# Patient Record
Sex: Male | Born: 1960
Health system: Southern US, Community
[De-identification: ages and names within clinical notes are randomized; demographics above are authoritative.]

## PROBLEM LIST (undated history)

## (undated) DIAGNOSIS — E7800 Pure hypercholesterolemia, unspecified: Secondary | ICD-10-CM

## (undated) DIAGNOSIS — K4090 Unilateral inguinal hernia, without obstruction or gangrene, not specified as recurrent: Secondary | ICD-10-CM

## (undated) DIAGNOSIS — E785 Hyperlipidemia, unspecified: Secondary | ICD-10-CM

## (undated) DIAGNOSIS — L309 Dermatitis, unspecified: Secondary | ICD-10-CM

## (undated) DIAGNOSIS — R0981 Nasal congestion: Secondary | ICD-10-CM

## (undated) HISTORY — DX: Unilateral inguinal hernia, without obstruction or gangrene, not specified as recurrent: K40.90

## (undated) HISTORY — DX: Nasal congestion: R09.81

## (undated) HISTORY — DX: Hyperlipidemia, unspecified: E78.5

## (undated) HISTORY — PX: HEMORRHOID SURGERY: SHX153

---

## 2008-10-21 ENCOUNTER — Ambulatory Visit: Payer: Self-pay | Admitting: Family Medicine

## 2008-10-21 DIAGNOSIS — E785 Hyperlipidemia, unspecified: Secondary | ICD-10-CM | POA: Insufficient documentation

## 2008-10-22 LAB — CONVERTED CEMR LAB
ALT: 32 units/L (ref 0–53)
AST: 26 units/L (ref 0–37)
Albumin: 4.8 g/dL (ref 3.5–5.2)
Alkaline Phosphatase: 62 units/L (ref 39–117)
BUN: 12 mg/dL (ref 6–23)
CO2: 22 meq/L (ref 19–32)
Calcium: 9.2 mg/dL (ref 8.4–10.5)
Chloride: 106 meq/L (ref 96–112)
Cholesterol: 179 mg/dL (ref 0–200)
Creatinine, Ser: 0.91 mg/dL (ref 0.40–1.50)
Glucose, Bld: 96 mg/dL (ref 70–99)
HDL: 33 mg/dL — ABNORMAL LOW (ref >39)
LDL Cholesterol: 90 mg/dL (ref 0–99)
Potassium: 4.3 meq/L (ref 3.5–5.3)
Sodium: 144 meq/L (ref 135–145)
Total Bilirubin: 0.8 mg/dL (ref 0.3–1.2)
Total CHOL/HDL Ratio: 5.4
Total Protein: 7.2 g/dL (ref 6.0–8.3)
Triglycerides: 280 mg/dL — ABNORMAL HIGH (ref <150)
VLDL: 56 mg/dL — ABNORMAL HIGH (ref 0–40)

## 2009-04-21 ENCOUNTER — Ambulatory Visit: Payer: Self-pay | Admitting: Family Medicine

## 2009-04-22 ENCOUNTER — Encounter: Payer: Self-pay | Admitting: Family Medicine

## 2009-04-22 LAB — CONVERTED CEMR LAB
Cholesterol: 187 mg/dL (ref 0–200)
HDL: 34 mg/dL — ABNORMAL LOW (ref >39)
LDL Cholesterol: 90 mg/dL (ref 0–99)
Total CHOL/HDL Ratio: 5.5
Triglycerides: 315 mg/dL — ABNORMAL HIGH (ref <150)
VLDL: 63 mg/dL — ABNORMAL HIGH (ref 0–40)

## 2009-04-26 ENCOUNTER — Telehealth: Payer: Self-pay | Admitting: Family Medicine

## 2009-09-30 ENCOUNTER — Ambulatory Visit: Payer: Self-pay | Admitting: Family

## 2009-10-11 ENCOUNTER — Telehealth: Payer: Self-pay | Admitting: Family

## 2009-10-12 ENCOUNTER — Ambulatory Visit: Payer: Self-pay | Admitting: Family

## 2009-10-28 ENCOUNTER — Telehealth: Payer: Self-pay | Admitting: Family

## 2009-11-02 ENCOUNTER — Telehealth: Payer: Self-pay | Admitting: Family

## 2009-11-02 ENCOUNTER — Encounter: Payer: Self-pay | Admitting: Family

## 2009-11-03 ENCOUNTER — Telehealth: Payer: Self-pay | Admitting: Family

## 2009-11-07 ENCOUNTER — Encounter: Payer: Self-pay | Admitting: Family

## 2009-12-14 ENCOUNTER — Ambulatory Visit: Payer: Self-pay | Admitting: Family Medicine

## 2009-12-15 LAB — CONVERTED CEMR LAB
ALT: 49 units/L (ref 0–53)
AST: 37 units/L (ref 0–37)
Albumin: 4.7 g/dL (ref 3.5–5.2)
Alkaline Phosphatase: 42 units/L (ref 39–117)
BUN: 13 mg/dL (ref 6–23)
CO2: 23 meq/L (ref 19–32)
Calcium: 9.1 mg/dL (ref 8.4–10.5)
Chloride: 107 meq/L (ref 96–112)
Cholesterol: 195 mg/dL (ref 0–200)
Creatinine, Ser: 1.04 mg/dL (ref 0.40–1.50)
Glucose, Bld: 92 mg/dL (ref 70–99)
HDL: 37 mg/dL — ABNORMAL LOW (ref >39)
LDL Cholesterol: 128 mg/dL — ABNORMAL HIGH (ref 0–99)
PSA: 0.64 ng/mL (ref 0.10–4.00)
Potassium: 4.2 meq/L (ref 3.5–5.3)
Sodium: 141 meq/L (ref 135–145)
Total Bilirubin: 0.7 mg/dL (ref 0.3–1.2)
Total CHOL/HDL Ratio: 5.3
Total Protein: 6.7 g/dL (ref 6.0–8.3)
Triglycerides: 152 mg/dL — ABNORMAL HIGH (ref <150)
VLDL: 30 mg/dL (ref 0–40)

## 2009-12-21 ENCOUNTER — Ambulatory Visit: Payer: Self-pay | Admitting: Family Medicine

## 2010-01-02 ENCOUNTER — Telehealth: Payer: Self-pay | Admitting: Family Medicine

## 2010-01-03 ENCOUNTER — Telehealth: Payer: Self-pay | Admitting: Family Medicine

## 2010-01-03 ENCOUNTER — Encounter: Payer: Self-pay | Admitting: Family Medicine

## 2010-01-10 ENCOUNTER — Telehealth (INDEPENDENT_AMBULATORY_CARE_PROVIDER_SITE_OTHER): Payer: Self-pay | Admitting: *Deleted

## 2010-04-18 ENCOUNTER — Ambulatory Visit: Payer: Self-pay | Admitting: Family Medicine

## 2010-05-12 ENCOUNTER — Telehealth: Payer: Self-pay | Admitting: Family Medicine

## 2010-06-20 NOTE — Progress Notes (Signed)
  Phone Note Outgoing Call   Call placed by: Lemont Fillers FNP,  November 03, 2009 8:13 AM Call placed to: Patient Summary of Call: Called patient- patient tells me that he saw opthalmology yesterday- they performed culture and discontinued drops.  He has f/u with them on Monday. Initial call taken by: Lemont Fillers FNP,  November 03, 2009 8:14 AM

## 2010-06-20 NOTE — Progress Notes (Signed)
Summary: ?referral  Phone Note Call from Patient Call back at [phone]   Caller: Patient Call For: Sandford Craze, NP Summary of Call: Pt called and states that he is still using the Ciloxin and that today his eye looks like it is infected again. Does he need referral to a specialist?  States that he is unable to come in for appt. until Monday due to staff shortage at work.  Please advise.  Mervin Kung CMA  November 02, 2009 1:51 PM   Follow-up for Phone Call        I would like for him to see an Opthalmologist and have made that referral. Should he develop eye pain or visual changes in the meantime he should be seen in the ED. Follow-up by: Lemont Fillers FNP,  November 02, 2009 2:04 PM  Additional Follow-up for Phone Call Additional follow up Details #1::        Pt advised of Delanda Bulluck's instructions. He asked that I call Dr. Otho Najjar [phone] and make appt with her.  I advised pt. that I would check with them but if they are not an opthalmologist we would have to refer elsewhere.  Spoke to Dr Wende Mott receptionist and she stated they do not have an ophthalmologist.  Mervin Kung CMA  November 02, 2009 2:43 PM     Additional Follow-up for Phone Call Additional follow up Details #2::    Diane spoke to pt and scheduled him an appt. for today with Dr. Hardie Shackleton in [place]. Pt has been notified.  Mervin Kung CMA  November 02, 2009 3:41 PM

## 2010-06-20 NOTE — Miscellaneous (Signed)
  Clinical Lists Changes  Medications: Removed medication of LIPITOR 80 MG TABS (ATORVASTATIN CALCIUM) Take 1 tablet by mouth once a day at bedtime Added new medication of LOVASTATIN 40 MG TABS (LOVASTATIN) take one tablet by mouth at bedtime - Signed Rx of LOVASTATIN 40 MG TABS (LOVASTATIN) take one tablet by mouth at bedtime;  #30 x 3;  Signed;  Entered by: Avon Gully CMA, (AAMA);  Authorized by: Nani Gasser MD;  Method used: Electronically to [facility] #[id number]*, [address] [place], [place]  [address], Ph: [phone], Fax: [phone]    Prescriptions: LOVASTATIN 40 MG TABS (LOVASTATIN) take one tablet by mouth at bedtime  #30 x 3   Entered by:   Avon Gully CMA, (AAMA)   Authorized by:   Nani Gasser MD   Signed by:   Avon Gully CMA, (AAMA) on 01/03/2010   Method used:   Electronically to        [facility] #[id number]* (retail)       [address], [place]  [address]       Ph: [phone]       Fax: [phone]   RxID:   [id number]

## 2010-06-20 NOTE — Assessment & Plan Note (Signed)
Summary: CPE   Vital Signs:  Patient profile:   50 year old male Height:      73 inches Weight:      238 pounds Pulse rate:   65 / minute BP sitting:   139 / 85  (left arm) Cuff size:   regular  Vitals Entered By: Avon Gully CMA, Duncan Dull) (December 21, 2009 11:04 AM) CC: CPE   Primary Care Provider:  Nani Gasser, MD  CC:  CPE.  History of Present Illness: Here for CPE. Lenses are up to date.  Here to review the labwork. Did increase the lovastatin and tolerating well.   Habits & Providers  Alcohol-Tobacco-Diet     Tobacco Status: quit  Exercise-Depression-Behavior     Does Patient Exercise: yes     Drug Use: no  Current Medications (verified): 1)  Lovastatin 40 Mg Tabs (Lovastatin) .... Take One Tablet By Mouth Once A Day At Bedtime 2)  Niacin 500 Mg Tabs (Niacin) .... Take One Tablet By Mouth Twice A Day 3)  Metamucil 30.9 % Powd (Psyllium) .... Daily 4)  Fish Oil 1000 Mg Caps (Omega-3 Fatty Acids) .... Take 3 Tabs By Mouth Once A Day. 5)  Fenofibrate Micronized 200 Mg Caps (Fenofibrate Micronized) .... Take 1 Tablet By Mouth Once A Day At Bedtime 6)  Zyrtec Allergy 10 Mg Tabs (Cetirizine Hcl) .... Take 1 Tablet By Mouth Once A Day  Allergies (verified): No Known Drug Allergies  Comments:  Nurse/Medical Assistant: The patient's medications and allergies were reviewed with the patient and were updated in the Medication and Allergy Lists. Avon Gully CMA, Duncan Dull) (December 21, 2009 11:04 AM)  Past History:  Past Medical History: Last updated: 10/21/2008 Hi cholesterol  Past Surgical History: Last updated: 10/21/2008 Hemorrhoidectomy  Family History: Last updated: 12/21/2009 Father with Dm, MI (triple bypass), melanoma, hi cholesterol Mom with HTN, and abdominal aneurysm.   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  [employer].  Married to [place].  Lives with his wife. No kids.   Former Smoker Alcohol use-yes beer  twice a week.  Drug use-no Regular exercise-yes, walking.   Family History: Father with Dm, MI (triple bypass), melanoma, hi cholesterol Mom with HTN, and abdominal aneurysm.   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  Married to [place].  Lives with his wife. No kids.   Former Smoker Alcohol use-yes beer twice a week.  Drug use-no Regular exercise-yes, walking.  Smoking Status:  quit Drug Use:  no Does Patient Exercise:  yes  Review of Systems  The patient denies anorexia, fever, weight loss, weight gain, vision loss, decreased hearing, hoarseness, chest pain, syncope, dyspnea on exertion, peripheral edema, prolonged cough, headaches, hemoptysis, abdominal pain, melena, hematochezia, severe indigestion/heartburn, hematuria, incontinence, genital sores, muscle weakness, suspicious skin lesions, transient blindness, difficulty walking, depression, unusual weight change, abnormal bleeding, enlarged lymph nodes, angioedema, and testicular masses.    Physical Exam  General:  Well-developed,well-nourished,in no acute distress; alert,appropriate and cooperative throughout examination Head:  Normocephalic and atraumatic without obvious abnormalities. No apparent alopecia or balding. Eyes:  No corneal or conjunctival inflammation noted. EOMI. Perrla.  Ears:  External ear exam shows no significant lesions or deformities.  Otoscopic examination reveals clear canals, tympanic membranes are intact bilaterally without bulging, retraction, inflammation or discharge. Hearing is grossly normal bilaterally. Nose:  External nasal examination shows no deformity or inflammation.  Mouth:  Oral mucosa and oropharynx without lesions or exudates.  Teeth  in good repair. Neck:  No deformities, masses, or tenderness noted. Chest Wall:  No deformities, masses, tenderness or gynecomastia noted. Lungs:  Normal respiratory effort, chest expands symmetrically. Lungs are clear to auscultation, no  crackles or wheezes. Heart:  Normal rate and regular rhythm. S1 and S2 normal without gallop, murmur, click, rub or other extra sounds. Abdomen:  Bowel sounds positive,abdomen soft and non-tender without masses, organomegaly or hernias noted. Rectal:  no external abnormalities, no hemorrhoids, normal sphincter tone, and no masses.   Prostate:  no gland enlargement, no nodules, and no asymmetry.   Msk:  No deformity or scoliosis noted of thoracic or lumbar spine.   Pulses:  R and L carotid,radial,dorsalis pedis and posterior tibial pulses are full and equal bilaterally Extremities:  No clubbing, cyanosis, edema, or deformity noted with normal full range of motion of all joints.   Neurologic:  No cranial nerve deficits noted. Station and gait are normal. DTRs are symmetrical throughout. Sensory, motor and coordinative functions appear intact. Skin:  no rashes.   Cervical Nodes:  No lymphadenopathy noted Psych:  Cognition and judgment appear intact. Alert and cooperative with normal attention span and concentration. No apparent delusions, illusions, hallucinations   Impression & Recommendations:  Problem # 1:  HEALTH MAINTENANCE EXAM (ICD-V70.0)  Exam is normal today.  Reviewed his labs.   EKG shows  NSR, rate 63 pm, Inverted T wave in Lead 3 but not significant.   Vaccines are up to date.   Orders: EKG w/ Interpretation (93000)  Problem # 2:  CORONARY ARTERY DISEASE, FAMILY HX (ICD-V17.3) Will get EKG as a baseline. He has not been having any CP.    Complete Medication List: 1)  Lovastatin 40 Mg Tabs (Lovastatin) .... Take one tablet by mouth once a day at bedtime 2)  Niacin 500 Mg Tabs (Niacin) .... Take one tablet by mouth twice a day 3)  Metamucil 30.9 % Powd (Psyllium) .... Daily 4)  Fish Oil 1000 Mg Caps (Omega-3 fatty acids) .... Take 3 tabs by mouth once a day. 5)  Fenofibrate Micronized 200 Mg Caps (Fenofibrate micronized) .... Take 1 tablet by mouth once a day at bedtime 6)   Zyrtec Allergy 10 Mg Tabs (Cetirizine hcl) .... Take 1 tablet by mouth once a day  Patient Instructions: 1)  Recheck cholesterol in 6 months.  2)  We would recommend one time screening for abdominal aortic aneurysm at age 25 since you are a former smoker.   Flex Sig Next Due:  Not Indicated Hemoccult Next Due:  Not Indicated  Appended Document: CPE No caortid bruits on exam.

## 2010-06-20 NOTE — Progress Notes (Signed)
Summary: Triage call- Call patient back   Phone Note Call from Patient   Caller: Patient Summary of Call: Pt. seen Sandford Craze last week and has been finished with the antibiotics for about 4days now and now his eye is starting to look bad again and bother the patient. Please call pt. back and let him know what you think ... Call patient at [phone] Initial call taken by: Michaelle Copas,  Oct 11, 2009 11:54 AM  Follow-up for Phone Call        Please call patient and let him know that he should be seen in the office.  I have some openings this afternoon.  Thanks Follow-up by: Lemont Fillers FNP,  Oct 11, 2009 1:10 PM  Additional Follow-up for Phone Call Additional follow up Details #1::        I called pt. and scheduled him for the fist appt. time with Sandford Craze at 0800 in the morning 10/12/09 at the [facility] Location.Michaelle Copas  Oct 11, 2009 4:34 PM  Additional Follow-up by: Michaelle Copas,  Oct 11, 2009 4:34 PM

## 2010-06-20 NOTE — Assessment & Plan Note (Signed)
Summary: Hyperlipidemia   Vital Signs:  Patient profile:   50 year old male Height:      73 inches Weight:      238 pounds BMI:     31.51 Pulse rate:   61 / minute BP sitting:   114 / 70  (left arm) Cuff size:   regular  Vitals Entered By: Avon Gully CMA, Duncan Dull) (December 14, 2009 8:33 AM) CC: f/u cholesterol, fasting   Primary Care Provider:  Nani Gasser, MD  CC:  f/u cholesterol and fasting.  History of Present Illness: No side effects from medications.  Doing well. Denies any side effects. They are affordable. Was unable to afford the Niaspan.    Current Medications (verified): 1)  Lovastatin 40 Mg Tabs (Lovastatin) .... Take One Tablet By Mouth Once A Day At Bedtime 2)  Niacin 500 Mg Tabs (Niacin) .... Take One Tablet By Mouth Twice A Day 3)  Metamucil 30.9 % Powd (Psyllium) .... Daily 4)  Fish Oil 1000 Mg Caps (Omega-3 Fatty Acids) .... Take 3 Tabs By Mouth Once A Day. 5)  Fenofibrate Micronized 200 Mg Caps (Fenofibrate Micronized) .... Take 1 Tablet By Mouth Once A Day At Bedtime 6)  Zyrtec Allergy 10 Mg Tabs (Cetirizine Hcl) .... Take 1 Tablet By Mouth Once A Day  Allergies (verified): No Known Drug Allergies  Comments:  Nurse/Medical Assistant: The patient's medications and allergies were reviewed with the patient and were updated in the Medication and Allergy Lists. Avon Gully CMA, Duncan Dull) (December 14, 2009 8:35 AM)  Physical Exam  General:  Well-developed,well-nourished,in no acute distress; alert,appropriate and cooperative throughout examination Lungs:  Normal respiratory effort, chest expands symmetrically. Lungs are clear to auscultation, no crackles or wheezes. Heart:  Normal rate and regular rhythm. S1 and S2 normal without gallop, murmur, click, rub or other extra sounds. Skin:  no rashes.   Psych:  Cognition and judgment appear intact. Alert and cooperative with normal attention span and concentration. No apparent delusions, illusions,  hallucinations   Impression & Recommendations:  Problem # 1:  HYPERLIPIDEMIA (ICD-272.4) Doing well. Due to recheck labs on the medication.  Can consider Simcor or Advicor  but likely will be more expensive.  Plans to schedule a CPE soon so will get complete labs ande check PSA since age 71.   His updated medication list for this problem includes:    Lovastatin 40 Mg Tabs (Lovastatin) .Marland Kitchen... Take one tablet by mouth once a day at bedtime    Niacin 500 Mg Tabs (Niacin) .Marland Kitchen... Take one tablet by mouth twice a day    Fenofibrate Micronized 200 Mg Caps (Fenofibrate micronized) .Marland Kitchen... Take 1 tablet by mouth once a day at bedtime  Orders: T-Lipid Profile ([id number])  Complete Medication List: 1)  Lovastatin 40 Mg Tabs (Lovastatin) .... Take one tablet by mouth once a day at bedtime 2)  Niacin 500 Mg Tabs (Niacin) .... Take one tablet by mouth twice a day 3)  Metamucil 30.9 % Powd (Psyllium) .... Daily 4)  Fish Oil 1000 Mg Caps (Omega-3 fatty acids) .... Take 3 tabs by mouth once a day. 5)  Fenofibrate Micronized 200 Mg Caps (Fenofibrate micronized) .... Take 1 tablet by mouth once a day at bedtime 6)  Zyrtec Allergy 10 Mg Tabs (Cetirizine hcl) .... Take 1 tablet by mouth once a day  Other Orders: T-Comprehensive Metabolic Panel ([id number]) T-PSA ([id number])  Patient Instructions: 1)  We will call you with your lab results. Let me know if interested in  the Simcor 500/40.  Prescriptions: LOVASTATIN 40 MG TABS (LOVASTATIN) Take one tablet by mouth once a day at bedtime  #90 x 3   Entered and Authorized by:   Nani Gasser MD   Signed by:   Nani Gasser MD on 12/14/2009   Method used:   Electronically to        [facility] #[id number]* (retail)       [address], [place]  [address]       Ph: [phone]       Fax: [phone]   RxID:   [id number] FENOFIBRATE MICRONIZED 200 MG CAPS (FENOFIBRATE MICRONIZED) Take 1 tablet by mouth once a day at  bedtime  #90 x 3   Entered and Authorized by:   Nani Gasser MD   Signed by:   Nani Gasser MD on 12/14/2009   Method used:   Electronically to        [facility] #[id number]* (retail)       [address], [place]  [address]       Ph: [phone]       Fax: [phone]   RxID:   [id number]

## 2010-06-20 NOTE — Progress Notes (Signed)
Summary: No discount thru ins for Niaspan  Phone Note Call from Patient Call back at Home Phone [phone] Call back at [phone]   Caller: Patient Call For: Nani Gasser MD Summary of Call: Can not get the discount with the Niaspan thru the insurane so what does [facility] need [facility]. [facility] Initial call taken by: Kathlene November,  April 26, 2009 11:27 AM  Follow-up for Phone Call        How much is it without the coupon card.  Follow-up by: Nani Gasser MD,  April 26, 2009 12:29 PM  Additional Follow-up for Phone Call Additional follow up Details #1::        He didn't say but he did say he could not afford it Additional Follow-up by: Kathlene November,  April 26, 2009 12:48 PM    Additional Follow-up for Phone Call Additional follow up Details #2::    Then can try generic tricor.  Follow-up by: Nani Gasser MD,  April 26, 2009 1:02 PM  Additional Follow-up for Phone Call Additional follow up Details #3:: Details for Additional Follow-up Action Taken: Pt notifeid to try the new med Additional Follow-up by: Kathlene November,  April 26, 2009 1:10 PM  New/Updated Medications: FENOFIBRATE MICRONIZED 200 MG CAPS (FENOFIBRATE MICRONIZED) Take 1 tablet by mouth once a day at bedtime Prescriptions: FENOFIBRATE MICRONIZED 200 MG CAPS (FENOFIBRATE MICRONIZED) Take 1 tablet by mouth once a day at bedtime  #30 x 2   Entered and Authorized by:   Nani Gasser MD   Signed by:   Nani Gasser MD on 04/26/2009   Method used:   Electronically to        [facility] #[id number]* (retail)       [address], [place]  [address]       Ph: [phone]       Fax: [phone]   RxID:   [id number]

## 2010-06-20 NOTE — Assessment & Plan Note (Signed)
Summary: Cody Huber PATIENT--ALLERGYS//VGJ   Vital Signs:  Patient profile:   50 year old male Height:      73 inches Weight:      237.75 pounds BMI:     31.48 Temp:     98.3 degrees F oral Pulse rate:   90 / minute Pulse rhythm:   regular Resp:     16 per minute BP sitting:   120 / 90  (right arm) Cuff size:   large  Vitals Entered By: Mervin Kung CMA (Sep 30, 2009 9:25 AM) CC: room 4  Bilateral eye redness, swelling and itching. Left eye seems worse than the right. Is Patient Diabetic? No   Primary Care Provider:  Nani Gasser, MD  CC:  room 4  Bilateral eye redness and swelling and itching. Left eye seems worse than the right..  History of Present Illness: Cody Huber is a 50 year old male who presents with c/o of 1 week of eye itching redness.  Eyes were crusted and shut yesterday.  Today continues to water.  He does have chronic nasal drainage which he attributes to seasonal allergies.  He has been using pataday eye drops without improvement.    Allergies (verified): No Known Drug Allergies  Physical Exam  General:  Well-developed,well-nourished,in no acute distress; alert,appropriate and cooperative throughout examination Head:  Normocephalic and atraumatic without obvious abnormalities. No apparent alopecia or balding. Eyes:  + scleral injection bilaterally left greater than right. Neck:  No deformities, masses, or tenderness noted. Lungs:  Normal respiratory effort, chest expands symmetrically. Lungs are clear to auscultation, no crackles or wheezes. Heart:  Normal rate and regular rhythm. S1 and S2 normal without gallop, murmur, click, rub or other extra sounds.   Impression & Recommendations:  Problem # 1:  CONJUNCTIVITIS, ACUTE, BILATERAL (ICD-372.00) Assessment New will treat with ciloxan drops.  Suspect that this started with allergic conjunctivitis, but has turned into bacterial infection.  Pt instructed to continue Zyrtec.  He is considering  referral to allergist- but not yet ready.  He will let us know.  The following medications were removed from the medication list:    Erythromycin 5 Mg/gm Oint (Erythromycin) .Marland Kitchen... Apply to affected eye twice a day. His updated medication list for this problem includes:    Ciloxan 0.3 % Soln (Ciprofloxacin hcl) .Marland Kitchen... 2 drops each eye every 2 hours wile awake x 2 days,  then every 4 hours for 5 days  Complete Medication List: 1)  Lovastatin 40 Mg Tabs (Lovastatin) .... Take one tablet by mouth once a day at bedtime 2)  Niacin 500 Mg Tabs (Niacin) .... Take one tablet by mouth twice a day 3)  Metamucil 30.9 % Powd (Psyllium) .... Daily 4)  Fish Oil 1000 Mg Caps (Omega-3 fatty acids) .... Take 4 tabs by mouth once a day. 5)  Fenofibrate Micronized 200 Mg Caps (Fenofibrate micronized) .... Take 1 tablet by mouth once a day at bedtime 6)  Pataday 0.2 % Soln (Olopatadine hcl) .... Instill one drop in ezch eye daily 7)  Zyrtec Allergy 10 Mg Tabs (Cetirizine hcl) .... Take 1 tablet by mouth once a day 8)  Ciloxan 0.3 % Soln (Ciprofloxacin hcl) .... 2 drops each eye every 2 hours wile awake x 2 days,  then every 4 hours for 5 days  Patient Instructions: 1)  Call if your symptoms worsen, do not improve. 2)  Call immediately if your develop visual problems. Prescriptions: CILOXAN 0.3 % SOLN (CIPROFLOXACIN HCL) 2 drops each eye  every 2 hours wile awake x 2 days,  then every 4 hours for 5 days  #1 x 0   Entered and Authorized by:   Lemont Fillers FNP   Signed by:   Lemont Fillers FNP on 09/30/2009   Method used:   Electronically to        [facility] #[id number]* (retail)       [address], [place]  [address]       Ph: [phone]       Fax: [phone]   RxID:   [id number]   Current Allergies (reviewed today): No known allergies

## 2010-06-20 NOTE — Assessment & Plan Note (Signed)
Summary: NOV: hyperlipidemia   Vital Signs:  Patient profile:   50 year old male Height:      73 inches Weight:      240 pounds BMI:     31.78 Pulse rate:   73 / minute BP sitting:   124 / 75  (left arm) Cuff size:   large  Vitals Entered By: Kathlene November (October 21, 2008 8:37 AM) CC: NP- needs cholesterol checked and med renewal Is Patient Diabetic? No Pain Assessment Patient in pain? no        Primary Care Provider:  Nani Gasser, MD  CC:  NP- needs cholesterol checked and med renewal.  History of Present Illness: NP- needs cholesterol checked and med renewal.  has been on current meds for awhile. Tolerating very well. Doesn't take an ASA.  Thinks last Td was about 10 years ago.   Current Medications (verified): 1)  Lovastatin 40 Mg Tabs (Lovastatin) .... Take One Tablet By Mouth Once A Day 2)  Niacin 500 Mg Tabs (Niacin) .... Take One Tablet By Mouth Twice A Day  Allergies (verified): No Known Drug Allergies  Comments:  Nurse/Medical Assistant: The patient's medications and allergies were reviewed with the patient and were updated in the Medication and Allergy Lists. Kathlene November (October 21, 2008 8:39 AM)  Past History:  Past Medical History: Hi cholesterol  Past Surgical History: Hemorrhoidectomy  Family History: Father with Dm, MI (triple bypass), melanoma, hi cholesterol Mom with HTN.   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  Married to [place].  Lives with his wife. No kids.    Review of Systems       No fever/sweats/weakness, unexplained weight loss/gain.  No vison changes.  No difficulty hearing/ringing in ears, hay fever/allergies.  No chest pain/discomfort, palpitations.  No Br lump/nipple discharge.  No cough/wheeze.  No blood in BM, nausea/vomiting/diarrhea.  No nighttime urination, leaking urine, unusual vaginal bleeding, discharge (penis or vagina).  No muscle/joint pain. No rash, change in mole.  No HA, memory loss.  No  anxiety, sleep d/o, depression.  No easy bruising/bleeding, unexplained lump   Physical Exam  General:  Well-developed,well-nourished,in no acute distress; alert,appropriate and cooperative throughout examination Head:  Normocephalic and atraumatic without obvious abnormalities. No apparent alopecia or balding. Eyes:  No corneal or conjunctival inflammation noted. EOMI. Perrla.  Neck:  No deformities, masses, or tenderness noted.  No TM.  Lungs:  Normal respiratory effort, chest expands symmetrically. Lungs are clear to auscultation, no crackles or wheezes. Heart:  Normal rate and regular rhythm. S1 and S2 normal without gallop, murmur, click, rub or other extra sounds.  No carotid bruits.  Pulses:  Radial 2+  Extremities:  No LE edema.  Skin:  no rashes.   Cervical Nodes:  No lymphadenopathy noted Psych:  Cognition and judgment appear intact. Alert and cooperative with normal attention span and concentration. No apparent delusions, illusions, hallucinations   Impression & Recommendations:  Problem # 1:  HYPERLIPIDEMIA (ICD-272.4) Due for labwork.  Also due for refills. Takes OTC niacin.  His updated medication list for this problem includes:    Lovastatin 40 Mg Tabs (Lovastatin) .Marland Kitchen... Take one tablet by mouth once a day at bedtime    Niacin 500 Mg Tabs (Niacin) .Marland Kitchen... Take one tablet by mouth twice a day  Orders: T-Ferritin ([id number]) T-Comprehensive Metabolic Panel ([id number])  Complete Medication List: 1)  Lovastatin 40 Mg Tabs (Lovastatin) .... Take one tablet by mouth once a day  at bedtime 2)  Niacin 500 Mg Tabs (Niacin) .... Take one tablet by mouth twice a day 3)  Metamucil 30.9 % Powd (Psyllium) .... Daily Prescriptions: LOVASTATIN 40 MG TABS (LOVASTATIN) Take one tablet by mouth once a day at bedtime  #90 x 1   Entered and Authorized by:   Nani Gasser MD   Signed by:   Nani Gasser MD on 10/21/2008   Method used:   Electronically to        [facility] #3303* (retail)       [address], [place]  [address]       Ph: [phone]       Fax: [phone]   RxID:   [id number]      Preventive Care Screening  Colonoscopy:    Date:  03/22/2003    Next Due:  03/2013    Results:  normal

## 2010-06-20 NOTE — Progress Notes (Signed)
  Phone Note Refill Request   Caller: Patient Call For: Nani Gasser MD Summary of Call: pt called and states he wants the Lovastatin to be called in for a 90 day supply because it is cheaper Initial call taken by: Avon Gully CMA, Duncan Dull),  January 10, 2010 9:57 AM    Prescriptions: LOVASTATIN 40 MG TABS (LOVASTATIN) take one tablet by mouth at bedtime  #90 x 1   Entered by:   Avon Gully CMA, (AAMA)   Authorized by:   Nani Gasser MD   Signed by:   Avon Gully CMA, (AAMA) on 01/10/2010   Method used:   Electronically to        [id number]* (retail)       [address] [place], [place]  [address]       Ph: [phone]       Fax: [phone]   RxID:   [id number]

## 2010-06-20 NOTE — Assessment & Plan Note (Signed)
Summary: FLU-SHOT  Nurse Visit   Allergies: No Known Drug Allergies  Immunizations Administered:  Influenza Vaccine # 1:    Vaccine Type: Fluvax 3+    Site: left deltoid    Mfr: GlaxoSmithKline    Dose: 0.5 ml    Route: IM    Given by: Sue Lush McCrimmon CMA, (AAMA)    Exp. Date: 10/20/2011    Lot #: [id number]    VIS given: 12/13/09 version given April 18, 2010.  Flu Vaccine Consent Questions:    Do you have a history of severe allergic reactions to this vaccine? no    Any prior history of allergic reactions to egg and/or gelatin? no    Do you have a sensitivity to the preservative Thimersol? no    Do you have a past history of Guillan-Barre Syndrome? no    Do you currently have an acute febrile illness? no    Have you ever had a severe reaction to latex? no    Vaccine information given and explained to patient? no  Orders Added: 1)  Flu Vaccine 75yrs + [90658] 2)  Admin 1st Vaccine [09811]

## 2010-06-20 NOTE — Progress Notes (Signed)
Summary: eye infection?  Phone Note Call from Patient Call back at [phone] Can leave detailed msg on voicemail.   Summary of Call: States he completed Polymixin for his eyes x 1 week. Symptoms have returned since yesterday.  Please advise.  Mervin Kung CMA  October 28, 2009 10:13 AM   Follow-up for Phone Call        Pls advise patient to try ciloxan x 7 days.  Call if symptoms worsen or do not improve. Follow-up by: Lemont Fillers FNP,  October 28, 2009 12:31 PM  Additional Follow-up for Phone Call Additional follow up Details #1::        Pt advised per Maryuri Warnke's instructions and voices understanding.  Nicki Guadalajara Fergerson CMA  October 28, 2009 2:25 PM     New/Updated Medications: CILOXAN 0.3 % SOLN (CIPROFLOXACIN HCL) 2 drops each eye every 2 hours wile awake x 2 days,  then every 4 hours for 7 days Prescriptions: CILOXAN 0.3 % SOLN (CIPROFLOXACIN HCL) 2 drops each eye every 2 hours wile awake x 2 days,  then every 4 hours for 7 days  #1 x 0   Entered and Authorized by:   Lemont Fillers FNP   Signed by:   Lemont Fillers FNP on 10/28/2009   Method used:   Electronically to        [id number]* (retail)       [address], [place]  [address]       Ph: [phone]       Fax: [phone]   RxID:   [id number]

## 2010-06-20 NOTE — Progress Notes (Signed)
Summary: meds  Phone Note Call from Patient   Caller: Patient Call For: Nani Gasser MD Summary of Call: pt wants to know if there is a generic for his lipitor because he has to pay for meds up front out of pocket Initial call taken by: Avon Gully CMA, Duncan Dull),  January 03, 2010 11:13 AM  Follow-up for Phone Call        what is avail is not strong enough for him, can stay on same med until the end of year or can look up Lipitor online and see if they have an indegent program per Dr. Linford Arnold Follow-up by: Avon Gully CMA, Duncan Dull),  January 03, 2010 11:15 AM  Additional Follow-up for Phone Call Additional follow up Details #1::        notified pt and he states he will call pharm first to see how much the medication is first and then decide Additional Follow-up by: Avon Gully CMA, Duncan Dull),  January 03, 2010 11:16 AM

## 2010-06-20 NOTE — Progress Notes (Signed)
Summary: meds  Phone Note Call from Patient   Caller: Patient Call For: Nani Gasser MD Summary of Call: pt states his lovastatin dose was supposed to be increased and it has not been changed Initial call taken by: Avon Gully CMA, Duncan Dull),  January 02, 2010 1:21 PM  Follow-up for Phone Call        OK, changed to lipitor 80mg . Can go online and print off coupion that makes the co-pay $4 and then recheck labs in 8 weeks.  Follow-up by: Nani Gasser MD,  January 02, 2010 2:01 PM  Additional Follow-up for Phone Call Additional follow up Details #1::        left message on pts cell Additional Follow-up by: Avon Gully CMA, Duncan Dull),  January 02, 2010 2:09 PM    New/Updated Medications: LIPITOR 80 MG TABS (ATORVASTATIN CALCIUM) Take 1 tablet by mouth once a day at bedtime Prescriptions: LIPITOR 80 MG TABS (ATORVASTATIN CALCIUM) Take 1 tablet by mouth once a day at bedtime  #30 x 2   Entered and Authorized by:   Nani Gasser MD   Signed by:   Nani Gasser MD on 01/02/2010   Method used:   Electronically to        [facility] #[id number]* (retail)       [address], [place]  [address]       Ph: [phone]       Fax: [phone]   RxID:   [id number]

## 2010-06-20 NOTE — Consult Note (Signed)
Summary: [name]  [facility]   Imported By: [name] 11/16/2009 08:19:40  _____________________________________________________________________  External Attachment:    Type:   Image     Comment:   External Document

## 2010-06-20 NOTE — Assessment & Plan Note (Signed)
Summary: FU Lipids, vaccines   Vital Signs:  Patient profile:   50 year old male Height:      73 inches Weight:      244 pounds Pulse rate:   70 / minute BP sitting:   128 / 88  (left arm) Cuff size:   large  Vitals Entered By: Kathlene November (April 21, 2009 8:53 AM) CC: follow-up on cholesterol Flu Vaccine Consent Questions     Do you have a history of severe allergic reactions to this vaccine? no    Any prior history of allergic reactions to egg and/or gelatin? no    Do you have a sensitivity to the preservative Thimersol? no    Do you have a past history of Guillan-Barre Syndrome? no    Do you currently have an acute febrile illness? no    Have you ever had a severe reaction to latex? no    Vaccine information given and explained to patient? yes    Are you currently pregnant? no    Lot Number:[id number]   Exp Date:11/17/2009   Site Given  Left Deltoid IM   Primary Care Provider:  Nani Gasser, MD  CC:  follow-up on cholesterol.  History of Present Illness: tolerating fish oil well overall.  Still on his other cholesterol meds. Still walking his dogs everyday, about  1 mile.  SayHe has been consistant with his meds.  Denies any side effects.  Still doesn't really have drug coverage.   Current Medications (verified): 1)  Lovastatin 40 Mg Tabs (Lovastatin) .... Take One Tablet By Mouth Once A Day At Bedtime 2)  Niacin 500 Mg Tabs (Niacin) .... Take One Tablet By Mouth Twice A Day 3)  Metamucil 30.9 % Powd (Psyllium) .... Daily 4)  Fish Oil 1000 Mg Caps (Omega-3 Fatty Acids) .... Take One Tablet By Mouth Twice A Day  Allergies (verified): No Known Drug Allergies  Comments:  Nurse/Medical Assistant: The patient's medications and allergies were reviewed with the patient and were updated in the Medication and Allergy Lists. Kathlene November (April 21, 2009 8:55 AM)  Physical Exam  General:  Well-developed,well-nourished,in no acute distress; alert,appropriate and  cooperative throughout examination Head:  Normocephalic and atraumatic without obvious abnormalities. No apparent alopecia or balding. Lungs:  Normal respiratory effort, chest expands symmetrically. Lungs are clear to auscultation, no crackles or wheezes. Heart:  Normal rate and regular rhythm. S1 and S2 normal without gallop, murmur, click, rub or other extra sounds. No carotid bruits.  Skin:  no rashes.   Psych:  Cognition and judgment appear intact. Alert and cooperative with normal attention span and concentration. No apparent delusions, illusions, hallucinations   Impression & Recommendations:  Problem # 1:  HYPERLIPIDEMIA (ICD-272.4) Due to recheck today. Hopefully the fish oil has helped his TG. Also gave him a coupon card to try to get niaspan 500 instead of the OTC niacin.  He says he will try.  FU in 3-4 months for a CPE at age 51.  BP looks great today.  His updated medication list for this problem includes:    Lovastatin 40 Mg Tabs (Lovastatin) .Marland Kitchen... Take one tablet by mouth once a day at bedtime    Niacin 500 Mg Tabs (Niacin) .Marland Kitchen... Take one tablet by mouth twice a day    Niaspan 500 Mg Cr-tabs (Niacin (antihyperlipidemic)) .Marland Kitchen... Take 1 tablet by mouth once a day  Orders: T-Lipid Profile ([address])  Complete Medication List: 1)  Lovastatin 40 Mg Tabs (Lovastatin) .Marland KitchenMarland KitchenMarland Kitchen  Take one tablet by mouth once a day at bedtime 2)  Niacin 500 Mg Tabs (Niacin) .... Take one tablet by mouth twice a day 3)  Metamucil 30.9 % Powd (Psyllium) .... Daily 4)  Fish Oil 1000 Mg Caps (Omega-3 fatty acids) .... Take one tablet by mouth twice a day 5)  Niaspan 500 Mg Cr-tabs (Niacin (antihyperlipidemic)) .... Take 1 tablet by mouth once a day  Other Orders: Admin 1st Vaccine ([address]) Flu Vaccine 63yrs + ([address]) Tdap => 57yrs IM (09811) Admin of Any Addtl Vaccine ([address]) Prescriptions: LOVASTATIN 40 MG TABS (LOVASTATIN) Take one tablet by mouth once a day at bedtime  #90 x 2   Entered and  Authorized by:   Nani Gasser MD   Signed by:   Nani Gasser MD on 04/21/2009   Method used:   Electronically to        [facility] #[id number]* (retail)       [address], [place]  [address]       Ph: [phone]       Fax: [phone]   RxID:   [id number] NIASPAN 500 MG CR-TABS (NIACIN (ANTIHYPERLIPIDEMIC)) Take 1 tablet by mouth once a day  #30 x 1   Entered and Authorized by:   Nani Gasser MD   Signed by:   Nani Gasser MD on 04/21/2009   Method used:   Print then Give to Patient   RxID:   [id number]     Immunizations Administered:  Tetanus Vaccine:    Vaccine Type: Tdap    Site: right deltoid    Mfr: GlaxoSmithKline    Dose: 0.5 ml    Route: IM    Given by: Kathlene November    Exp. Date: 12/04/2010    Lot #: [id number]    VIS given: 04/08/07 version given April 21, 2009.

## 2010-06-20 NOTE — Assessment & Plan Note (Signed)
Summary: f/u on eyes, getting worse- jr--room 4   Vital Signs:  Patient profile:   50 year old male Height:      73 inches Weight:      238.75 pounds BMI:     31.61 Temp:     98.2 degrees F oral Pulse rate:   72 / minute Pulse rhythm:   regular Resp:     16 per minute BP sitting:   138 / 84 Cuff size:   large  Vitals Entered By: Mervin Kung CMA (Oct 12, 2009 8:12 AM) Is Patient Diabetic? No   Primary Care Provider:  Nani Gasser, MD   History of Present Illness: Cody Huber is a 50 year old male who presents today for follow up of his conjunctivitis.  He was treated with ciloxan drops x 5 days.  Noted that his eyes felt better within a day or two of starting ciloxin drops. However, after completing these drops, he started to have recurrent redness/yellow discharge and tearing.  He continues to use his daily patanol.   Denies any visual changes.    Allergies (verified): No Known Drug Allergies  Physical Exam  General:  Well-developed,well-nourished,in no acute distress; alert,appropriate and cooperative throughout examination Eyes:  PERRLA,  mild bilateral scleral injection L>R.  Mild irritation noted of eye lids.  No appreciable discharge noted on exam. Neurologic:  EOM's intact, gross vision testing is normal   Impression & Recommendations:  Problem # 1:  CONJUNCTIVITIS, ACUTE, BILATERAL (ICD-372.00) Assessment New Plan to continue patanol.  Will try Polytrim x 10 days.  Pt instructed to call if symptoms worsen or do not improve.  Go to ER if visual impairment develops. His updated medication list for this problem includes:    Polytrim 10000-0.1 Unit/ml-% Soln (Polymyxin b-trimethoprim) ..... One drop in each eye every 3 hours x 7-10 days  Complete Medication List: 1)  Lovastatin 40 Mg Tabs (Lovastatin) .... Take one tablet by mouth once a day at bedtime 2)  Niacin 500 Mg Tabs (Niacin) .... Take one tablet by mouth twice a day 3)  Metamucil 30.9 % Powd  (Psyllium) .... Daily 4)  Fish Oil 1000 Mg Caps (Omega-3 fatty acids) .... Take 3 tabs by mouth once a day. 5)  Fenofibrate Micronized 200 Mg Caps (Fenofibrate micronized) .... Take 1 tablet by mouth once a day at bedtime 6)  Pataday 0.2 % Soln (Olopatadine hcl) .... Instill one drop in ezch eye daily 7)  Zyrtec Allergy 10 Mg Tabs (Cetirizine hcl) .... Take 1 tablet by mouth once a day 8)  Polytrim 10000-0.1 Unit/ml-% Soln (Polymyxin b-trimethoprim) .... One drop in each eye every 3 hours x 7-10 days  Patient Instructions: 1)  Call if symptoms worsen, or if not resolved in 1 week. 2)  Go to ER in the event that you develop visual problems. Prescriptions: POLYTRIM 10000-0.1 UNIT/ML-% SOLN (POLYMYXIN B-TRIMETHOPRIM) one drop in each eye every 3 hours x 7-10 days  #1 x 0   Entered and Authorized by:   Lemont Fillers FNP   Signed by:   Lemont Fillers FNP on 10/12/2009   Method used:   Electronically to        [facility] #[id number]* (retail)       [address], [place]  [address]       Ph: [phone]       Fax: [phone]   RxID:   [id number]   Current  Allergies (reviewed today): No known allergies    Vital Signs:  Patient Profile:   50 year old male Height:     73 inches Weight:      238.75 pounds BMI:     31.61 Temp:     98.2 degrees F oral Pulse rate:   72 / minute Pulse rhythm:   regular Resp:     16 per minute BP sitting:   138 / 84  (right arm) Cuff size:   large

## 2010-06-20 NOTE — Letter (Signed)
Summary: [name]  [facility]   Imported By: [name] 11/16/2009 08:20:38  _____________________________________________________________________  External Attachment:    Type:   Image     Comment:   External Document

## 2010-06-22 NOTE — Progress Notes (Signed)
Summary: meds  Phone Note Call from Patient   Caller: Patient Call For: Nani Gasser MD Summary of Call: pt called and states he was to change his medication over to Lipitor at the first of the year. Pt states  there is no hurry because he still has the other med Initial call taken by: Avon Gully CMA, Duncan Dull),  May 12, 2010 12:36 PM    New/Updated Medications: * ATORVASTATIN 20 MG 1 tab by mouth qhs Prescriptions: ATORVASTATIN 20 MG 1 tab by mouth qhs  #30 x 3   Entered and Authorized by:   Seymour Bars DO   Signed by:   Seymour Bars DO on 05/12/2010   Method used:   Printed then faxed to ...       [facility] [address] (retail)       [address], [place]  [address]       Ph: [phone]       Fax: [phone]   RxID:   [id number]

## 2010-06-27 ENCOUNTER — Ambulatory Visit: Payer: Self-pay | Admitting: Family Medicine

## 2010-07-04 ENCOUNTER — Encounter: Payer: Self-pay | Admitting: Family Medicine

## 2010-07-04 ENCOUNTER — Ambulatory Visit (INDEPENDENT_AMBULATORY_CARE_PROVIDER_SITE_OTHER): Payer: PRIVATE HEALTH INSURANCE | Admitting: Family Medicine

## 2010-07-04 DIAGNOSIS — E785 Hyperlipidemia, unspecified: Secondary | ICD-10-CM

## 2010-07-05 LAB — CONVERTED CEMR LAB
ALT: 38 units/L (ref 0–53)
AST: 28 units/L (ref 0–37)
Albumin: 4.8 g/dL (ref 3.5–5.2)
Alkaline Phosphatase: 42 units/L (ref 39–117)
BUN: 15 mg/dL (ref 6–23)
CO2: 25 meq/L (ref 19–32)
Calcium: 9.1 mg/dL (ref 8.4–10.5)
Chloride: 107 meq/L (ref 96–112)
Cholesterol: 148 mg/dL (ref 0–200)
Creatinine, Ser: 1.05 mg/dL (ref 0.40–1.50)
Glucose, Bld: 98 mg/dL (ref 70–99)
HDL: 36 mg/dL — ABNORMAL LOW (ref >39)
LDL Cholesterol: 97 mg/dL (ref 0–99)
Potassium: 4 meq/L (ref 3.5–5.3)
Sodium: 141 meq/L (ref 135–145)
Total Bilirubin: 0.7 mg/dL (ref 0.3–1.2)
Total CHOL/HDL Ratio: 4.1
Total Protein: 6.9 g/dL (ref 6.0–8.3)
Triglycerides: 73 mg/dL (ref <150)
VLDL: 15 mg/dL (ref 0–40)

## 2010-07-12 NOTE — Assessment & Plan Note (Signed)
Summary: f/u lipids-fasting   Vital Signs:  Patient profile:   50 year old male Height:      73 inches Weight:      233 pounds Pulse rate:   63 / minute BP sitting:   123 / 78  (right arm) Cuff size:   large  Vitals Entered By: Avon Gully CMA, (AAMA) (July 04, 2010 8:21 AM) CC: f/u chol   Primary Care Provider:  Nani Gasser, MD  CC:  f/u chol.  History of Present Illness: Has lost 5 lbs. No CP or SOB or muscle aches or pain.   Current Medications (verified): 1)  Niacin 500 Mg Tabs (Niacin) .... Take One Tablet By Mouth Twice A Day 2)  Metamucil 30.9 % Powd (Psyllium) .... Daily 3)  Fish Oil 1000 Mg Caps (Omega-3 Fatty Acids) .... Take 3 Tabs By Mouth Once A Day. 4)  Fenofibrate Micronized 200 Mg Caps (Fenofibrate Micronized) .... Take 1 Tablet By Mouth Once A Day At Bedtime 5)  Zyrtec Allergy 10 Mg Tabs (Cetirizine Hcl) .... Take 1 Tablet By Mouth Once A Day 6)  Atorvastatin 20 Mg .Marland Kitchen.. 1 Tab By Mouth Qhs  Allergies (verified): No Known Drug Allergies  Comments:  Nurse/Medical Assistant: The patient's medications and allergies were reviewed with the patient and were updated in the Medication and Allergy Lists. Avon Gully CMA, Duncan Dull) (July 04, 2010 8:22 AM)  Physical Exam  General:  Well-developed,well-nourished,in no acute distress; alert,appropriate and cooperative throughout examination Lungs:  Normal respiratory effort, chest expands symmetrically. Lungs are clear to auscultation, no crackles or wheezes. Heart:  Normal rate and regular rhythm. S1 and S2 normal without gallop, murmur, click, rub or other extra sounds. No carotid bruits.  Skin:  no rashes.   Cervical Nodes:  No lymphadenopathy noted   Impression & Recommendations:  Problem # 1:  HYPERLIPIDEMIA (ICD-272.4) Doing well on teh atorvastatin. nO SE. Due to recheck levels to make sure at goal.  Adjust if needed and if at goal then will recheck in one year.  Note BP and  weight look much better this time. Gave him encouragement.  His updated medication list for this problem includes:    Niacin 500 Mg Tabs (Niacin) .Marland Kitchen... Take one tablet by mouth twice a day    Fenofibrate Micronized 200 Mg Caps (Fenofibrate micronized) .Marland Kitchen... Take 1 tablet by mouth once a day at bedtime    Atorvastatin Calcium 20 Mg Tabs (Atorvastatin calcium) .Marland Kitchen... Take 1 tablet by mouth once a day at bedtime  Orders: T-Lipid Profile ([id number]) T-Comprehensive Metabolic Panel ([id number])  Complete Medication List: 1)  Niacin 500 Mg Tabs (Niacin) .... Take one tablet by mouth twice a day 2)  Metamucil 30.9 % Powd (Psyllium) .... Daily 3)  Fish Oil 1000 Mg Caps (Omega-3 fatty acids) .... Take 3 tabs by mouth once a day. 4)  Fenofibrate Micronized 200 Mg Caps (Fenofibrate micronized) .... Take 1 tablet by mouth once a day at bedtime 5)  Zyrtec Allergy 10 Mg Tabs (Cetirizine hcl) .... Take 1 tablet by mouth once a day 6)  Atorvastatin Calcium 20 Mg Tabs (Atorvastatin calcium) .... Take 1 tablet by mouth once a day at bedtime  Patient Instructions: 1)  We will call you with the lab results.    Orders Added: 1)  T-Lipid Profile [[id number]] 2)  T-Comprehensive Metabolic Panel [[id number]] 3)  Est. Patient Level II [[address]]    Flex Sig Next Due:  Not Indicated

## 2010-09-29 ENCOUNTER — Other Ambulatory Visit: Payer: Self-pay | Admitting: Family Medicine

## 2010-10-06 ENCOUNTER — Telehealth: Payer: Self-pay | Admitting: Family Medicine

## 2010-10-06 DIAGNOSIS — E785 Hyperlipidemia, unspecified: Secondary | ICD-10-CM

## 2010-10-06 MED ORDER — FENOFIBRATE MICRONIZED 200 MG PO CAPS: 200.0000 mg | ORAL_CAPSULE | Freq: Every day | ORAL | Status: DC

## 2010-10-06 NOTE — Telephone Encounter (Signed)
Pt called and wants to know if he still needs to be on the fenofibrate 200 mg??  ALso on the LIpitor.    Plan:  Pt needs to continue the Fenofibrate 200mg .  # 30/6 refills was sent to the pharm because pt said provider said to repeat and follow up for the chol next Feb 2013.  Rx refill was sent to [facility].  Pt informed a refill was sent  to his pharmacy. Jarvis Newcomer, LPN Domingo Dimes

## 2011-01-23 ENCOUNTER — Other Ambulatory Visit: Payer: Self-pay | Admitting: *Deleted

## 2011-01-23 MED ORDER — ATORVASTATIN CALCIUM 20 MG PO TABS: 20.0000 mg | ORAL_TABLET | Freq: Every day | ORAL | Status: DC

## 2011-04-05 ENCOUNTER — Ambulatory Visit (INDEPENDENT_AMBULATORY_CARE_PROVIDER_SITE_OTHER): Payer: PRIVATE HEALTH INSURANCE | Admitting: Family Medicine

## 2011-04-05 DIAGNOSIS — Z23 Encounter for immunization: Secondary | ICD-10-CM

## 2011-04-05 NOTE — Progress Notes (Signed)
Here for flu shot

## 2011-04-26 ENCOUNTER — Other Ambulatory Visit: Payer: Self-pay | Admitting: Family Medicine

## 2011-05-03 ENCOUNTER — Ambulatory Visit (INDEPENDENT_AMBULATORY_CARE_PROVIDER_SITE_OTHER): Payer: PRIVATE HEALTH INSURANCE | Admitting: Family Medicine

## 2011-05-03 ENCOUNTER — Encounter: Payer: Self-pay | Admitting: Family Medicine

## 2011-05-03 VITALS — BP 100/63 | HR 90 | Ht 73.0 in | Wt 237.0 lb

## 2011-05-03 DIAGNOSIS — Z0000 Encounter for general adult medical examination without abnormal findings: Secondary | ICD-10-CM

## 2011-05-03 LAB — LIPID PANEL
Cholesterol: 163 mg/dL (ref 0–200)
HDL: 37 mg/dL — ABNORMAL LOW (ref >39)
LDL Cholesterol: 106 mg/dL — ABNORMAL HIGH (ref 0–99)
Total CHOL/HDL Ratio: 4.4 Ratio
Triglycerides: 102 mg/dL (ref <150)
VLDL: 20 mg/dL (ref 0–40)

## 2011-05-03 LAB — PSA: PSA: 0.74 ng/mL (ref <=4.00)

## 2011-05-03 MED ORDER — TRIAMCINOLONE ACETONIDE 0.5 % EX OINT: TOPICAL_OINTMENT | Freq: Two times a day (BID) | CUTANEOUS | Status: DC

## 2011-05-03 NOTE — Progress Notes (Signed)
  Subjective:    Patient ID: Cody Huber, male    DOB: 1961-04-22, 50 y.o.   MRN: [id number]  HPI  Here for CPE. Wants to dicuss shingles vaccine and bump on her right shoulder/back. Also has red spots on his hands and back of knees and near axilla.  Has tried hand creams but not really helping. Tried cortisone cream and helped some.   Review of Systems Comprehensive ROS is neg.  BP 100/63  Pulse 90  Ht 6\' 1"  (1.854 m)  Wt 237 lb (107.502 kg)  BMI 31.27 kg/m2    No Known Allergies  Past Medical History  Diagnosis Date  . Hyperlipidemia     Past Surgical History  Procedure Date  . Hemorrhoid surgery     History   Social History  . Marital Status: Single    Spouse Name: N/A    Number of Children: N/A  . Years of Education: N/A   Occupational History  . Not on file.   Social History Main Topics  . Smoking status: Former Games developer  . Smokeless tobacco: Not on file  . Alcohol Use: 1.0 oz/week    2 drink(s) per week  . Drug Use: No  . Sexually Active: Not on file   Other Topics Concern  . Not on file   Social History Narrative  . No narrative on file    Family History  Problem Relation Age of Onset  . Diabetes Father   . Melanoma Father   . Hyperlipidemia Father   . Hypertension Mother         Objective:   Physical Exam  Constitutional: He is oriented to person, place, and time. He appears well-developed and well-nourished.  HENT:  Head: Normocephalic and atraumatic.  Right Ear: External ear normal.  Left Ear: External ear normal.  Nose: Nose normal.  Mouth/Throat: Oropharynx is clear and moist.  Eyes: Conjunctivae and EOM are normal. Pupils are equal, round, and reactive to light.  Neck: Normal range of motion. Neck supple. No thyromegaly present.  Cardiovascular: Normal rate, regular rhythm, normal heart sounds and intact distal pulses.   Pulmonary/Chest: Effort normal and breath sounds normal.  Abdominal: Soft. Bowel sounds are normal. He  exhibits no distension and no mass. There is no tenderness. There is no rebound and no guarding.  Genitourinary: Rectum normal and prostate normal. Guaiac negative stool. No penile tenderness.  Musculoskeletal: Normal range of motion.  Lymphadenopathy:    He has no cervical adenopathy.  Neurological: He is alert and oriented to person, place, and time. He has normal reflexes.  Skin: Skin is warm and dry.  Psychiatric: He has a normal mood and affect. His behavior is normal. Judgment and thought content normal.     Approx 2.0 cm sebaceous cyst on the right shoulder/back. He has red dry scalling patch behind the right knee, left axilla, and dry red craking hands bilat.      Assessment & Plan:  CPE Keep up your regular exercise program and make sure you are eating a healthy diet Try to eat 4 servings of dairy a day or take a calcium supplement (500mg  twice a day). Your vaccines are up to date.  Due fore screening labs.   Sebaceous cyst - can refer to derm for removal whenever pt would like. He will think about it  Eczema- Apply triamcinolone and then eucerine on top. Make sure mosturizing well.   Shingles vaccine - check with insurance first to see if covered.

## 2011-05-03 NOTE — Patient Instructions (Signed)
Keep up your regular exercise program and make sure you are eating a healthy diet Try to eat 4 servings of dairy a day or take a calcium supplement (500mg twice a day). Your vaccines are up to date.   

## 2011-05-04 LAB — COMPLETE METABOLIC PANEL WITH GFR
ALT: 41 U/L (ref 0–53)
AST: 31 U/L (ref 0–37)
Albumin: 4.8 g/dL (ref 3.5–5.2)
Alkaline Phosphatase: 40 U/L (ref 39–117)
BUN: 13 mg/dL (ref 6–23)
CO2: 25 mEq/L (ref 19–32)
Calcium: 9.8 mg/dL (ref 8.4–10.5)
Chloride: 105 mEq/L (ref 96–112)
Creat: 1.03 mg/dL (ref 0.50–1.35)
GFR, Est African American: >89 mL/min
GFR, Est Non African American: 84 mL/min
Glucose, Bld: 93 mg/dL (ref 70–99)
Potassium: 4.3 mEq/L (ref 3.5–5.3)
Sodium: 141 mEq/L (ref 135–145)
Total Bilirubin: 0.8 mg/dL (ref 0.3–1.2)
Total Protein: 6.9 g/dL (ref 6.0–8.3)

## 2011-05-26 ENCOUNTER — Other Ambulatory Visit: Payer: Self-pay | Admitting: Family Medicine

## 2011-06-04 ENCOUNTER — Telehealth: Payer: Self-pay | Admitting: *Deleted

## 2011-06-04 DIAGNOSIS — L723 Sebaceous cyst: Secondary | ICD-10-CM

## 2011-06-04 NOTE — Telephone Encounter (Signed)
Referral entered  

## 2011-06-04 NOTE — Telephone Encounter (Signed)
Pt called states that cyst on back and neck is still bothering him. Would like referral sent to derm.

## 2011-06-05 NOTE — Telephone Encounter (Signed)
Lm for pt that referral has been sent

## 2011-06-19 ENCOUNTER — Encounter: Payer: Self-pay | Admitting: Family Medicine

## 2011-06-19 ENCOUNTER — Ambulatory Visit (INDEPENDENT_AMBULATORY_CARE_PROVIDER_SITE_OTHER): Payer: PRIVATE HEALTH INSURANCE | Admitting: Family Medicine

## 2011-06-19 DIAGNOSIS — K4090 Unilateral inguinal hernia, without obstruction or gangrene, not specified as recurrent: Secondary | ICD-10-CM

## 2011-06-19 NOTE — Progress Notes (Signed)
  Subjective:    Patient ID: Cody Huber, male    DOB: 09/14/1960, 51 y.o.   MRN: [id number]  HPI He is here today to followup a bump in his groin area. He thinks it is a possible hernia on the right. We had previously examined the area that it was benign feeling. He says now it is much more painful and bothers him on the weekends when he works around the house at home. He has a fairly sedentary job during the week and it doesn't bother him then. He denies any redness. He says it does swell at times. He also complains of occasional burning sensation. He has not tried any over-the-counter medications except for Tylenol one time. He is unsure if it really helped or not. He denies any pain in the area with normal range of motion of that hip. He has had no changes in his medications.   Review of Systems     Objective:   Physical Exam  Constitutional: He appears well-developed and well-nourished.  Genitourinary:       He has a large bulge in the right groin area. He does have significant tenderness with exam of the inguinal canal. I am able to compress the area. No redness.          Assessment & Plan:  Right inguinal hernia-will refer to [facility] surgery for surgical evaluation. I warned of the risks of not having it treated. We also discussed possible recovery if he does have surgery. Fortunately he has a very sedentary job so hopefully he will be able to get back to work early. He can certainly wear a hernia belt if he can be very active to help keep his symptoms under control. He is scheduled the emergency room if the area gets very hard swollen or he becomes nauseated. Didn't warned about the potential for incarcerated  Hernia.

## 2011-07-10 ENCOUNTER — Encounter (INDEPENDENT_AMBULATORY_CARE_PROVIDER_SITE_OTHER): Payer: Self-pay | Admitting: Surgery

## 2011-07-10 ENCOUNTER — Ambulatory Visit (INDEPENDENT_AMBULATORY_CARE_PROVIDER_SITE_OTHER): Payer: PRIVATE HEALTH INSURANCE | Admitting: Surgery

## 2011-07-10 VITALS — BP 128/86 | HR 68 | Temp 97.9°F | Resp 18 | Ht 71.0 in | Wt 236.8 lb

## 2011-07-10 DIAGNOSIS — M6200 Separation of muscle (nontraumatic), unspecified site: Secondary | ICD-10-CM

## 2011-07-10 DIAGNOSIS — M6208 Separation of muscle (nontraumatic), other site: Secondary | ICD-10-CM | POA: Insufficient documentation

## 2011-07-10 DIAGNOSIS — K4020 Bilateral inguinal hernia, without obstruction or gangrene, not specified as recurrent: Secondary | ICD-10-CM

## 2011-07-10 NOTE — Patient Instructions (Signed)
Hernia °A hernia occurs when an internal organ pushes out through a weak spot in the abdominal wall. Hernias most commonly occur in the groin and around the navel. Hernias often can be pushed back into place (reduced). Most hernias tend to get worse over time. Some abdominal hernias can get stuck in the opening (irreducible or incarcerated hernia) and cannot be reduced. An irreducible abdominal hernia which is tightly squeezed into the opening is at risk for impaired blood supply (strangulated hernia). A strangulated hernia is a medical emergency. Because of the risk for an irreducible or strangulated hernia, surgery may be recommended to repair a hernia. °CAUSES  °· Heavy lifting.  °· Prolonged coughing.  °· Straining to have a bowel movement.  °· A cut (incision) made during an abdominal surgery.  °HOME CARE INSTRUCTIONS  °· Bed rest is not required. You may continue your normal activities.  °· Avoid lifting more than 10 pounds (4.5 kg) or straining.  °· Cough gently. If you are a smoker it is best to stop. Even the best hernia repair can break down with the continual strain of coughing. Even if you do not have your hernia repaired, a cough will continue to aggravate the problem.  °· Do not wear anything tight over your hernia. Do not try to keep it in with an outside bandage or truss. These can damage abdominal contents if they are trapped within the hernia sac.  °· Eat a normal diet.  °· Avoid constipation. Straining over long periods of time will increase hernia size and encourage breakdown of repairs. If you cannot do this with diet alone, stool softeners may be used.  °SEEK IMMEDIATE MEDICAL CARE IF:  °· You have a fever.  °· You develop increasing abdominal pain.  °· You feel nauseous or vomit.  °· Your hernia is stuck outside the abdomen, looks discolored, feels hard, or is tender.  °· You have any changes in your bowel habits or in the hernia that are unusual for you.  °· You have increased pain or  swelling around the hernia.  °· You cannot push the hernia back in place by applying gentle pressure while lying down.  °MAKE SURE YOU:  °· Understand these instructions.  °· Will watch your condition.  °· Will get help right away if you are not doing well or get worse.  °Document Released: 05/07/2005 Document Revised: 01/17/2011 Document Reviewed: 12/25/2007 °[employer]® Patient Information ©2012 [employer]. °

## 2011-07-10 NOTE — Progress Notes (Signed)
Subjective:     Patient ID: Cody Huber, male   DOB: 06/18/1960, 50 y.o.   MRN: [id number]  HPI  Cody Huber  07/14/1960 [id number]  Patient Care Team: Catherine Metheney, MD as PCP - General  This patient is a 50 y.o.male who presents today for surgical evaluation at the request of Dr. Metheney.   Reason for visit: Right groin bulging. Probable hernia.  The patient is a healthy overweight male. Rather active. Noticed bulging in his right groin a few months ago. Saw his primary care physician. There was concern of hernia. He was sent to us for surgical evaluation.  He had hemorrhoid surgery in the past. Had colonoscopy which showed nothing. No abdominal hernia surgeries. Mild hesitancy with urination but no prostate  problems. No skin infections or MRSA.  Patient Active Problem List  Diagnoses  . HYPERLIPIDEMIA  . Bilateral inguinal hernia (R>>L)  . Diastasis recti    Past Medical History  Diagnosis Date  . Hyperlipidemia   . Nasal congestion   . Right inguinal hernia     Past Surgical History  Procedure Date  . Hemorrhoid surgery 2006 - approximate    History   Social History  . Marital Status: Single    Spouse Name: Lisa    Number of Children: 0  . Years of Education: BS   Occupational History  . Asst Manager     [employer]   Social History Main Topics  . Smoking status: Former Smoker    Quit date: 05/21/1984  . Smokeless tobacco: Never Used  . Alcohol Use: 1.0 oz/week    2 drink(s) per week     5-6 beers per week  . Drug Use: No  . Sexually Active: Yes -- Male partner(s)   Other Topics Concern  . Not on file   Social History Narrative   Regular exercise. Walking 6 days per week.     Family History  Problem Relation Age of Onset  . Diabetes Father   . Melanoma Father   . Hyperlipidemia Father   . Hypertension Mother     Current outpatient prescriptions:atorvastatin (LIPITOR) 20 MG tablet, take 1 tablet by mouth once daily, Disp: 30  tablet, Rfl: 3;  cetirizine (ZYRTEC) 10 MG tablet, Take 10 mg by mouth daily.  , Disp: , Rfl: ;  fenofibrate micronized (LOFIBRA) 200 MG capsule, take 1 capsule by mouth at bedtime, Disp: 30 capsule, Rfl: 6;  fish oil-omega-3 fatty acids 1000 MG capsule, Take 3 g by mouth daily.  , Disp: , Rfl:  niacinamide 500 MG tablet, Take 500 mg by mouth 2 (two) times daily with a meal.  , Disp: , Rfl: ;  triamcinolone ointment (KENALOG) 0.5 %, Apply topically 2 (two) times daily., Disp: 30 g, Rfl: 1  No Known Allergies  BP 128/86  Pulse 68  Temp(Src) 97.9 F (36.6 C) (Temporal)  Resp 18  Ht 5' 11" (1.803 m)  Wt 236 lb 12.8 oz (107.412 kg)  BMI 33.03 kg/m2     Review of Systems  Constitutional: Negative for fever, chills and diaphoresis.  HENT: Negative for nosebleeds, sore throat, facial swelling, mouth sores, trouble swallowing and ear discharge.   Eyes: Negative for photophobia, discharge and visual disturbance.  Respiratory: Negative for choking, chest tightness, shortness of breath and stridor.   Cardiovascular: Negative for chest pain and palpitations.       Patient walks 60 minutes for about 1 miles w dogs daily without difficulty.  No exertional chest/neck/shoulder/arm pain.    Gastrointestinal: Negative for nausea, vomiting, abdominal pain, diarrhea, constipation, blood in stool, abdominal distention, anal bleeding and rectal pain.  Genitourinary: Negative for dysuria, urgency, difficulty urinating and testicular pain.  Musculoskeletal: Negative for myalgias, back pain, arthralgias and gait problem.  Skin: Negative for color change, pallor, rash and wound.  Neurological: Negative for dizziness, speech difficulty, weakness, numbness and headaches.  Hematological: Negative for adenopathy. Does not bruise/bleed easily.  Psychiatric/Behavioral: Negative for hallucinations, confusion and agitation.       Objective:   Physical Exam  Constitutional: He is oriented to person, place, and time.  He appears well-developed and well-nourished. No distress.  HENT:  Head: Normocephalic.  Mouth/Throat: Oropharynx is clear and moist. No oropharyngeal exudate.  Eyes: Conjunctivae and EOM are normal. Pupils are equal, round, and reactive to light. No scleral icterus.  Neck: Normal range of motion. Neck supple. No tracheal deviation present.  Cardiovascular: Normal rate, regular rhythm and intact distal pulses.   Pulmonary/Chest: Effort normal and breath sounds normal. No respiratory distress.  Abdominal: Soft. He exhibits no distension. There is no tenderness. Hernia confirmed negative in the right inguinal area and confirmed negative in the left inguinal area.    Musculoskeletal: Normal range of motion. He exhibits no tenderness.  Lymphadenopathy:    He has no cervical adenopathy.       Right: No inguinal adenopathy present.       Left: No inguinal adenopathy present.  Neurological: He is alert and oriented to person, place, and time. No cranial nerve deficit. He exhibits normal muscle tone. Coordination normal.  Skin: Skin is warm and dry. No rash noted. He is not diaphoretic. No erythema. No pallor.  Psychiatric: He has a normal mood and affect. His behavior is normal. Judgment and thought content normal.       Assessment:     BIH, R>>L    Plan:     I think he would benefit with laparoscopic exploration. I would plan to look at both sides and return him. He is interested in proceeding as soon as possible. He is worried about time off from work and if that will be allowed. I cautioned him against trying to return to work too soon. He is rather healthy and active, so I hope he will recover smoothly.  The anatomy & physiology of the abdominal wall was discussed.  The pathophysiology of hernias was discussed.  Natural history risks without surgery of enlargement, pain, incarceration & strangulation was discussed.   Contributors to complications such as smoking, obesity, diabetes, prior  surgery, etc were discussed.  I feel the risks of no intervention will lead to serious problems that outweigh the operative risks; therefore, I recommended surgery to reduce and repair the hernia.  I explained laparoscopic techniques with possible need for an open approach.  I noted the probable use of mesh to patch and/or buttress hernia repair  Risks such as bleeding, infection, abscess, need for further treatment, heart attack, death, and other risks were discussed.  I noted a good likelihood this will help address the problem.   Goals of post-operative recovery were discussed as well.  Possibility that this will not correct all symptoms was explained.  I stressed the importance of low-impact activity, aggressive pain control, avoiding constipation, & not pushing through pain to minimize risk of post-operative chronic pain or injury. Possibility of reherniation was discussed.  We will work to minimize complications.   An educational handout further explaining the pathology & treatment options was given as   well.  Questions were answered.  The patient expresses understanding & wishes to proceed with surgery.         

## 2011-07-13 ENCOUNTER — Telehealth (INDEPENDENT_AMBULATORY_CARE_PROVIDER_SITE_OTHER): Payer: Self-pay | Admitting: General Surgery

## 2011-07-13 NOTE — Telephone Encounter (Signed)
DARLENE CALLED TO SAY ORDERS ARE NEEDED IN EPIC FOR PT [facility]/ SURGERY IS 07-24-11// PHONE # [phone]/ Cleophus Molt

## 2011-07-16 ENCOUNTER — Encounter (HOSPITAL_COMMUNITY): Payer: Self-pay | Admitting: Pharmacy Technician

## 2011-07-18 ENCOUNTER — Other Ambulatory Visit (INDEPENDENT_AMBULATORY_CARE_PROVIDER_SITE_OTHER): Payer: Self-pay | Admitting: Surgery

## 2011-07-19 ENCOUNTER — Encounter (HOSPITAL_COMMUNITY): Payer: Self-pay

## 2011-07-19 ENCOUNTER — Encounter (HOSPITAL_COMMUNITY)
Admission: RE | Admit: 2011-07-19 | Discharge: 2011-07-19 | Disposition: A | Discharge status: Home / Self Care | Payer: PRIVATE HEALTH INSURANCE | Source: Ambulatory Visit | Attending: Surgery | Admitting: Surgery

## 2011-07-19 HISTORY — DX: Pure hypercholesterolemia, unspecified: E78.00

## 2011-07-19 HISTORY — DX: Dermatitis, unspecified: L30.9

## 2011-07-19 LAB — CBC
HCT: 46.3 % (ref 39.0–52.0)
Hemoglobin: 16.2 g/dL (ref 13.0–17.0)
MCH: 29.5 pg (ref 26.0–34.0)
MCHC: 35 g/dL (ref 30.0–36.0)
MCV: 84.3 fL (ref 78.0–100.0)
Platelets: 203 10*3/uL (ref 150–400)
RBC: 5.49 MIL/uL (ref 4.22–5.81)
RDW: 12.8 % (ref 11.5–15.5)
WBC: 5.7 10*3/uL (ref 4.0–10.5)

## 2011-07-19 LAB — SURGICAL PCR SCREEN
MRSA, PCR: NEGATIVE
Staphylococcus aureus: NEGATIVE

## 2011-07-19 MED ORDER — CHLORHEXIDINE GLUCONATE 4 % EX LIQD
1.0000 "application " | Freq: Once | CUTANEOUS | Status: DC
  Filled 2011-07-19: qty 15

## 2011-07-19 NOTE — Patient Instructions (Signed)
20 Cody Huber  07/19/2011   Your procedure is scheduled on:  07/24/11  Report to SHORT STAY DEPT  at 5:30 AM.  Call this number if you have problems the morning of surgery: [phone]   Remember:   Do not eat food or drink liquids AFTER MIDNIGHT    Take these medicines the morning of surgery with A SIP OF WATER: NONE   Do not wear jewelry, make-up or nail polish.  Do not wear lotions, powders, or perfumes. You may wear deodorant.  Do not shave legs or underarms 48 hrs. before surgery (men may shave face)  Do not bring valuables to the hospital.  Contacts, dentures or bridgework may not be worn into surgery.  Leave suitcase in the car. After surgery it may be brought to your room.  For patients admitted to the hospital, checkout time is 11:00 AM the day of discharge.   Patients discharged the day of surgery will not be allowed to drive home.  Name and phone number of your driver:   Special Instructions:   Please read over the following fact sheets that you were given: MRSA  Information               SHOWER WITH BETASEPT THE NIGHT BEFORE SURGERY AND THE MORNING OF SURGERY

## 2011-07-23 NOTE — Anesthesia Preprocedure Evaluation (Addendum)

## 2011-07-24 ENCOUNTER — Ambulatory Visit (HOSPITAL_COMMUNITY): Payer: PRIVATE HEALTH INSURANCE | Admitting: Anesthesiology

## 2011-07-24 ENCOUNTER — Encounter (HOSPITAL_COMMUNITY): Payer: Self-pay | Admitting: Anesthesiology

## 2011-07-24 ENCOUNTER — Ambulatory Visit (HOSPITAL_COMMUNITY)
Admission: RE | Admit: 2011-07-24 | Discharge: 2011-07-24 | Disposition: A | Discharge status: Home / Self Care | Payer: PRIVATE HEALTH INSURANCE | Source: Ambulatory Visit | Attending: Surgery | Admitting: Surgery

## 2011-07-24 ENCOUNTER — Encounter (HOSPITAL_COMMUNITY)
Admission: RE | Disposition: A | Discharge status: Home / Self Care | Payer: Self-pay | Source: Ambulatory Visit | Attending: Surgery

## 2011-07-24 ENCOUNTER — Encounter (HOSPITAL_COMMUNITY): Payer: Self-pay | Admitting: *Deleted

## 2011-07-24 DIAGNOSIS — K4020 Bilateral inguinal hernia, without obstruction or gangrene, not specified as recurrent: Secondary | ICD-10-CM

## 2011-07-24 DIAGNOSIS — E785 Hyperlipidemia, unspecified: Secondary | ICD-10-CM | POA: Insufficient documentation

## 2011-07-24 DIAGNOSIS — Z01812 Encounter for preprocedural laboratory examination: Secondary | ICD-10-CM | POA: Insufficient documentation

## 2011-07-24 DIAGNOSIS — K458 Other specified abdominal hernia without obstruction or gangrene: Secondary | ICD-10-CM | POA: Insufficient documentation

## 2011-07-24 HISTORY — PX: INGUINAL HERNIA REPAIR: SHX194

## 2011-07-24 SURGERY — REPAIR, HERNIA, INGUINAL, BILATERAL, LAPAROSCOPIC
Anesthesia: General | Site: Abdomen | Laterality: Bilateral | Wound class: Clean

## 2011-07-24 MED ORDER — DEXAMETHASONE SODIUM PHOSPHATE 10 MG/ML IJ SOLN
INTRAMUSCULAR | Status: DC | PRN
  Administered 2011-07-24: 10 mg via INTRAVENOUS

## 2011-07-24 MED ORDER — ACETAMINOPHEN 650 MG RE SUPP
650.0000 mg | RECTAL | Status: DC | PRN
  Filled 2011-07-24: qty 1

## 2011-07-24 MED ORDER — ONDANSETRON HCL 4 MG/2ML IJ SOLN: 4.0000 mg | Freq: Four times a day (QID) | INTRAMUSCULAR | Status: DC | PRN

## 2011-07-24 MED ORDER — OXYCODONE HCL 5 MG PO TABS: 5.0000 mg | ORAL_TABLET | ORAL | Status: DC | PRN

## 2011-07-24 MED ORDER — FENTANYL CITRATE 0.05 MG/ML IJ SOLN: 25.0000 ug | INTRAMUSCULAR | Status: DC | PRN

## 2011-07-24 MED ORDER — KETOROLAC TROMETHAMINE 30 MG/ML IJ SOLN
INTRAMUSCULAR | Status: DC | PRN
  Administered 2011-07-24: 30 mg via INTRAVENOUS

## 2011-07-24 MED ORDER — BUPIVACAINE-EPINEPHRINE 0.25% -1:200000 IJ SOLN
INTRAMUSCULAR | Status: AC
  Filled 2011-07-24: qty 1

## 2011-07-24 MED ORDER — SODIUM CHLORIDE 0.9 % IJ SOLN: 3.0000 mL | Freq: Two times a day (BID) | INTRAMUSCULAR | Status: DC

## 2011-07-24 MED ORDER — NEOSTIGMINE METHYLSULFATE 1 MG/ML IJ SOLN
INTRAMUSCULAR | Status: DC | PRN
  Administered 2011-07-24: 5 mg via INTRAVENOUS

## 2011-07-24 MED ORDER — LACTATED RINGERS IV SOLN: INTRAVENOUS | Status: DC

## 2011-07-24 MED ORDER — PROPOFOL 10 MG/ML IV BOLUS
INTRAVENOUS | Status: DC | PRN
  Administered 2011-07-24: 200 mg via INTRAVENOUS

## 2011-07-24 MED ORDER — ROCURONIUM BROMIDE 100 MG/10ML IV SOLN
INTRAVENOUS | Status: DC | PRN
  Administered 2011-07-24: 5 mg via INTRAVENOUS
  Administered 2011-07-24: 40 mg via INTRAVENOUS

## 2011-07-24 MED ORDER — LIDOCAINE HCL (CARDIAC) 20 MG/ML IV SOLN
INTRAVENOUS | Status: DC | PRN
  Administered 2011-07-24: 50 mg via INTRAVENOUS

## 2011-07-24 MED ORDER — ACETAMINOPHEN 10 MG/ML IV SOLN
INTRAVENOUS | Status: DC | PRN
  Administered 2011-07-24: 1000 mg via INTRAVENOUS

## 2011-07-24 MED ORDER — SUCCINYLCHOLINE CHLORIDE 20 MG/ML IJ SOLN
INTRAMUSCULAR | Status: DC | PRN
  Administered 2011-07-24: 100 mg via INTRAVENOUS

## 2011-07-24 MED ORDER — ACETAMINOPHEN 10 MG/ML IV SOLN
INTRAVENOUS | Status: AC
  Filled 2011-07-24: qty 100

## 2011-07-24 MED ORDER — LACTATED RINGERS IV SOLN
INTRAVENOUS | Status: DC | PRN
  Administered 2011-07-24 (×2): via INTRAVENOUS

## 2011-07-24 MED ORDER — SODIUM CHLORIDE 0.9 % IJ SOLN: 3.0000 mL | INTRAMUSCULAR | Status: DC | PRN

## 2011-07-24 MED ORDER — HYDROMORPHONE HCL PF 1 MG/ML IJ SOLN
INTRAMUSCULAR | Status: AC
  Filled 2011-07-24: qty 1

## 2011-07-24 MED ORDER — GLYCOPYRROLATE 0.2 MG/ML IJ SOLN
INTRAMUSCULAR | Status: DC | PRN
  Administered 2011-07-24: 0.6 mg via INTRAVENOUS

## 2011-07-24 MED ORDER — MIDAZOLAM HCL 5 MG/5ML IJ SOLN
INTRAMUSCULAR | Status: DC | PRN
  Administered 2011-07-24: 2 mg via INTRAVENOUS

## 2011-07-24 MED ORDER — CEFAZOLIN SODIUM 1-5 GM-% IV SOLN
INTRAVENOUS | Status: AC
  Filled 2011-07-24: qty 100

## 2011-07-24 MED ORDER — HYDROMORPHONE HCL PF 1 MG/ML IJ SOLN
0.2500 mg | INTRAMUSCULAR | Status: DC | PRN
  Administered 2011-07-24: 0.25 mg via INTRAVENOUS

## 2011-07-24 MED ORDER — ACETAMINOPHEN 325 MG PO TABS: 650.0000 mg | ORAL_TABLET | ORAL | Status: DC | PRN

## 2011-07-24 MED ORDER — FENTANYL CITRATE 0.05 MG/ML IJ SOLN
INTRAMUSCULAR | Status: DC | PRN
  Administered 2011-07-24: 100 ug via INTRAVENOUS
  Administered 2011-07-24 (×2): 50 ug via INTRAVENOUS

## 2011-07-24 MED ORDER — SODIUM CHLORIDE 0.9 % IV SOLN: 250.0000 mL | INTRAVENOUS | Status: DC | PRN

## 2011-07-24 MED ORDER — CEFAZOLIN SODIUM-DEXTROSE 2-3 GM-% IV SOLR
2.0000 g | INTRAVENOUS | Status: AC
  Administered 2011-07-24: 2 g via INTRAVENOUS

## 2011-07-24 MED ORDER — ONDANSETRON HCL 4 MG/2ML IJ SOLN
INTRAMUSCULAR | Status: DC | PRN
  Administered 2011-07-24: 4 mg via INTRAVENOUS

## 2011-07-24 MED ORDER — BUPIVACAINE-EPINEPHRINE PF 0.25-1:200000 % IJ SOLN
INTRAMUSCULAR | Status: DC | PRN
  Administered 2011-07-24: 50 mL

## 2011-07-24 SURGICAL SUPPLY — 34 items
BARD SOFT MESH ×4 IMPLANT
CANISTER SUCTION 2500CC (MISCELLANEOUS) ×2 IMPLANT
CHLORAPREP W/TINT 26ML (MISCELLANEOUS) ×2 IMPLANT
CLOTH BEACON ORANGE TIMEOUT ST (SAFETY) ×2 IMPLANT
DECANTER SPIKE VIAL GLASS SM (MISCELLANEOUS) ×2 IMPLANT
DISSECTOR BLUNT TIP ENDO 5MM (MISCELLANEOUS) IMPLANT
DRAPE LAPAROSCOPIC ABDOMINAL (DRAPES) ×2 IMPLANT
DRSG TEGADERM 2-3/8X2-3/4 SM (GAUZE/BANDAGES/DRESSINGS) ×4 IMPLANT
DRSG TEGADERM 4X4.75 (GAUZE/BANDAGES/DRESSINGS) ×2 IMPLANT
DURAPREP 26ML APPLICATOR (WOUND CARE) IMPLANT
ELECT REM PT RETURN 9FT ADLT (ELECTROSURGICAL) ×2
ELECTRODE REM PT RTRN 9FT ADLT (ELECTROSURGICAL) ×1 IMPLANT
GLOVE BIOGEL PI IND STRL 7.0 (GLOVE) ×1 IMPLANT
GLOVE BIOGEL PI INDICATOR 7.0 (GLOVE) ×1
GLOVE ECLIPSE 8.0 STRL XLNG CF (GLOVE) ×2 IMPLANT
GLOVE INDICATOR 8.0 STRL GRN (GLOVE) ×2 IMPLANT
GOWN STRL NON-REIN LRG LVL3 (GOWN DISPOSABLE) ×2 IMPLANT
GOWN STRL REIN XL XLG (GOWN DISPOSABLE) ×4 IMPLANT
IV LACTATED RINGERS 1000ML (IV SOLUTION) IMPLANT
KIT BASIN OR (CUSTOM PROCEDURE TRAY) ×2 IMPLANT
NEEDLE INSUFFLATION 14GA 120MM (NEEDLE) IMPLANT
PEN SKIN MARKING BROAD (MISCELLANEOUS) ×2 IMPLANT
SET IRRIG TUBING LAPAROSCOPIC (IRRIGATION / IRRIGATOR) IMPLANT
SLEEVE Z-THREAD 5X100MM (TROCAR) ×2 IMPLANT
SOLUTION ANTI FOG 6CC (MISCELLANEOUS) ×2 IMPLANT
SUT MNCRL AB 4-0 PS2 18 (SUTURE) ×2 IMPLANT
SUT VIC AB 3-0 SH 27 (SUTURE) ×1
SUT VIC AB 3-0 SH 27XBRD (SUTURE) ×1 IMPLANT
TACKER 5MM HERNIA 3.5CML NAB (ENDOMECHANICALS) ×2 IMPLANT
TOWEL OR 17X26 10 PK STRL BLUE (TOWEL DISPOSABLE) ×2 IMPLANT
TRAY LAP CHOLE (CUSTOM PROCEDURE TRAY) ×2 IMPLANT
TROCAR HASSON GELL 12X100 (TROCAR) ×2 IMPLANT
TROCAR Z-THREAD FIOS 5X100MM (TROCAR) ×2 IMPLANT
TUBING INSUFFLATION 10FT LAP (TUBING) ×2 IMPLANT

## 2011-07-24 NOTE — Brief Op Note (Addendum)
07/24/2011  11:19 AM  PATIENT:  Cody Huber  51 y.o. male  Patient Care Team: Nani Gasser, MD as PCP - General  PRE-OPERATIVE DIAGNOSIS:  bilateral ingunial hernia  POST-OPERATIVE DIAGNOSIS:  bilateral ingunial hernia Right obturator hernia  PROCEDURE:  Procedure(s) (LRB): LAPAROSCOPIC BILATERAL INGUINAL HERNIA REPAIR (Bilateral) Lap Right obturator hernia repair INSERTION OF MESH (Bilateral)  SURGEON:  Surgeon(s) and Role:    * Ardeth Sportsman, MD - Primary  PHYSICIAN ASSISTANT:   ASSISTANTS: none   ANESTHESIA:   local and general  EBL:  Total I/O In: 1640 [P.O.:240; I.V.:1400] Out: 250 [Urine:250]  BLOOD ADMINISTERED:none  DRAINS: none   LOCAL MEDICATIONS USED:  BUPIVICAINE  and Amount: 50 ml  SPECIMEN:  No Specimen  DISPOSITION OF SPECIMEN:  N/A  COUNTS:  YES  TOURNIQUET:  * No tourniquets in log *  DICTATION: .Other Dictation: Dictation Number   OR findings:  The patient had a moderate pntaloon-type right inguinal hernia. Direct greater than indirect. Small obturator hernia on the right side as well. Patient had a smaller indirect left inguinal hernia. No evidence of any femoral hernia defects. There is no left Dr. defect.  The patient was identified & brought into the operating room. The patient was positioned supine with arms tucked. SCDs were active during the entire case. The patient underwent general anesthesia without any difficulty.  The abdomen was prepped and draped in a sterile fashion. A Surgical Timeout confirmed our plan.  I made a transverse incision through the inferior umbilical fold.  I made a small transverse nick through the anterior rectus fascia contralateral to the inguinal hernia side and placed a 0-vicryl stitch through the fascia.  I placed a Hasson trocar into the preperitoneal plane.  Entry was clean.  We induced carbon dioxide insufflation. Camera inspection revealed no injury.  I used a 10mm angled scope to bluntly free the  peritoneum off the infraumbilical anterior abdominal wall.  I created enough of a preperitoneal pocket to place 5mm ports into the right & left mid-abdomen into this preperitoneal cavity.  I used blunt & focused sharp dissection to free the peritoneum off the flank and down to the pubic rim.  I freed the anteriolateral bladder wall off the anteriolateral pelvic wall, sparing midline attachments.    I freed the peritoneum off the spermatic vessels & vas deferens.  I freed peritoneum off the retroperitoneum along the psoas muscle.  There was a small breach in Left hernia sac peritoneum that required closure.  I checked & assured hemostasis.    I chose a 15x15 cm sheet of ultra-lightweight polypropylene mesh (Ultrapro) for each side.  I cut a sigmoid-shaped slit at the side of the mesh.  I placed the mesh into the preperitoneal space & laid it as a diamond such that the inferior point was a 6x6 cm corner flap resting in the true pelvis, covering the obturator & femoral foramina.   The medial corner stretched across midline cephalad to the pubic rim.   This provided >2 inch coverage around the hernias.  Because there was a large right direct defect, I decided to use spiral titanium tacs to hold the mesh in place along the pubic rim , linea alba, and upper outer edge of the meshes above the inguinal ligament.  I held the meshes in place & evacuated carbon dioxide.  I closed the fascia with absorbable suture.  I closed the skin using 4-0 monocryl stitch.  Sterile dressings were applied. The patient was  extubated & arrived in the PACU in stable condition..  I had discussed postoperative care with the patient in the holding area.I discussed the patient's status to the pt's wife.  Questions were answered.  They expressed understanding & appreciation.  Instructions are written in the chart as well.   PLAN OF CARE: Discharge to home after PACU  PATIENT DISPOSITION:  PACU - hemodynamically stable.   Delay start  of Pharmacological VTE agent (>24hrs) due to surgical blood loss or risk of bleeding: not applicable

## 2011-07-24 NOTE — H&P (View-Only) (Signed)
Subjective:     Patient ID: Cody Huber, male   DOB: 25-Apr-1961, 51 y.o.   MRN: [id number]  HPI  Cody Huber  09/03/1960 [id number]  Patient Care Team: Nani Gasser, MD as PCP - General  This patient is a 52 y.o.male who presents today for surgical evaluation at the request of Dr. Linford Arnold.   Reason for visit: Right groin bulging. Probable hernia.  The patient is a healthy overweight male. Rather active. Noticed bulging in his right groin a few months ago. Saw his primary care physician. There was concern of hernia. He was sent to [place] for surgical evaluation.  He had hemorrhoid surgery in the past. Had colonoscopy which showed nothing. No abdominal hernia surgeries. Mild hesitancy with urination but no prostate  problems. No skin infections or MRSA.  Patient Active Problem List  Diagnoses  . HYPERLIPIDEMIA  . Bilateral inguinal hernia (R>>L)  . Diastasis recti    Past Medical History  Diagnosis Date  . Hyperlipidemia   . Nasal congestion   . Right inguinal hernia     Past Surgical History  Procedure Date  . Hemorrhoid surgery 2006 - approximate    History   Social History  . Marital Status: Single    Spouse Name: Misty Stanley    Number of Children: 0  . Years of Education: BS   Occupational History  . Asst Manager     [employer]   Social History Main Topics  . Smoking status: Former Smoker    Quit date: 05/21/1984  . Smokeless tobacco: Never Used  . Alcohol Use: 1.0 oz/week    2 drink(s) per week     5-6 beers per week  . Drug Use: No  . Sexually Active: Yes -- Male partner(s)   Other Topics Concern  . Not on file   Social History Narrative   Regular exercise. Walking 6 days per week.     Family History  Problem Relation Age of Onset  . Diabetes Father   . Melanoma Father   . Hyperlipidemia Father   . Hypertension Mother     Current outpatient prescriptions:atorvastatin (LIPITOR) 20 MG tablet, take 1 tablet by mouth once daily, Disp: 30  tablet, Rfl: 3;  cetirizine (ZYRTEC) 10 MG tablet, Take 10 mg by mouth daily.  , Disp: , Rfl: ;  fenofibrate micronized (LOFIBRA) 200 MG capsule, take 1 capsule by mouth at bedtime, Disp: 30 capsule, Rfl: 6;  fish oil-omega-3 fatty acids 1000 MG capsule, Take 3 g by mouth daily.  , Disp: , Rfl:  niacinamide 500 MG tablet, Take 500 mg by mouth 2 (two) times daily with a meal.  , Disp: , Rfl: ;  triamcinolone ointment (KENALOG) 0.5 %, Apply topically 2 (two) times daily., Disp: 30 g, Rfl: 1  No Known Allergies  BP 128/86  Pulse 68  Temp(Src) 97.9 F (36.6 C) (Temporal)  Resp 18  Ht 5\' 11"  (1.803 m)  Wt 236 lb 12.8 oz (107.412 kg)  BMI 33.03 kg/m2     Review of Systems  Constitutional: Negative for fever, chills and diaphoresis.  HENT: Negative for nosebleeds, sore throat, facial swelling, mouth sores, trouble swallowing and ear discharge.   Eyes: Negative for photophobia, discharge and visual disturbance.  Respiratory: Negative for choking, chest tightness, shortness of breath and stridor.   Cardiovascular: Negative for chest pain and palpitations.       Patient walks 60 minutes for about 1 miles w dogs daily without difficulty.  No exertional chest/neck/shoulder/arm pain.  Gastrointestinal: Negative for nausea, vomiting, abdominal pain, diarrhea, constipation, blood in stool, abdominal distention, anal bleeding and rectal pain.  Genitourinary: Negative for dysuria, urgency, difficulty urinating and testicular pain.  Musculoskeletal: Negative for myalgias, back pain, arthralgias and gait problem.  Skin: Negative for color change, pallor, rash and wound.  Neurological: Negative for dizziness, speech difficulty, weakness, numbness and headaches.  Hematological: Negative for adenopathy. Does not bruise/bleed easily.  Psychiatric/Behavioral: Negative for hallucinations, confusion and agitation.       Objective:   Physical Exam  Constitutional: He is oriented to person, place, and time.  He appears well-developed and well-nourished. No distress.  HENT:  Head: Normocephalic.  Mouth/Throat: Oropharynx is clear and moist. No oropharyngeal exudate.  Eyes: Conjunctivae and EOM are normal. Pupils are equal, round, and reactive to light. No scleral icterus.  Neck: Normal range of motion. Neck supple. No tracheal deviation present.  Cardiovascular: Normal rate, regular rhythm and intact distal pulses.   Pulmonary/Chest: Effort normal and breath sounds normal. No respiratory distress.  Abdominal: Soft. He exhibits no distension. There is no tenderness. Hernia confirmed negative in the right inguinal area and confirmed negative in the left inguinal area.    Musculoskeletal: Normal range of motion. He exhibits no tenderness.  Lymphadenopathy:    He has no cervical adenopathy.       Right: No inguinal adenopathy present.       Left: No inguinal adenopathy present.  Neurological: He is alert and oriented to person, place, and time. No cranial nerve deficit. He exhibits normal muscle tone. Coordination normal.  Skin: Skin is warm and dry. No rash noted. He is not diaphoretic. No erythema. No pallor.  Psychiatric: He has a normal mood and affect. His behavior is normal. Judgment and thought content normal.       Assessment:     BIH, R>>L    Plan:     I think he would benefit with laparoscopic exploration. I would plan to look at both sides and return him. He is interested in proceeding as soon as possible. He is worried about time off from work and if that will be allowed. I cautioned him against trying to return to work too soon. He is rather healthy and active, so I hope he will recover smoothly.  The anatomy & physiology of the abdominal wall was discussed.  The pathophysiology of hernias was discussed.  Natural history risks without surgery of enlargement, pain, incarceration & strangulation was discussed.   Contributors to complications such as smoking, obesity, diabetes, prior  surgery, etc were discussed.  I feel the risks of no intervention will lead to serious problems that outweigh the operative risks; therefore, I recommended surgery to reduce and repair the hernia.  I explained laparoscopic techniques with possible need for an open approach.  I noted the probable use of mesh to patch and/or buttress hernia repair  Risks such as bleeding, infection, abscess, need for further treatment, heart attack, death, and other risks were discussed.  I noted a good likelihood this will help address the problem.   Goals of post-operative recovery were discussed as well.  Possibility that this will not correct all symptoms was explained.  I stressed the importance of low-impact activity, aggressive pain control, avoiding constipation, & not pushing through pain to minimize risk of post-operative chronic pain or injury. Possibility of reherniation was discussed.  We will work to minimize complications.   An educational handout further explaining the pathology & treatment options was given as  well.  Questions were answered.  The patient expresses understanding & wishes to proceed with surgery.

## 2011-07-24 NOTE — Interval H&P Note (Signed)
History and Physical Interval Note:  07/24/2011 6:51 AM  Cody Huber  has presented today for surgery, with the diagnosis of bilateral ingunial hernia  The various methods of treatment have been discussed with the patient and family. After consideration of risks, benefits and other options for treatment, the patient has consented to  Procedure(s) (LRB): LAPAROSCOPIC BILATERAL INGUINAL HERNIA REPAIR (Bilateral) INSERTION OF MESH (Bilateral) as a surgical intervention .  The patients' history has been reviewed, patient examined, no change in status, stable for surgery.  I have reviewed the patients' chart and labs.  Questions were answered to the patient's satisfaction.     Cesilia Shinn C.

## 2011-07-24 NOTE — Anesthesia Postprocedure Evaluation (Signed)
  Anesthesia Post-op Note  Patient: Cody Huber  Procedure(s) Performed: Procedure(s) (LRB): LAPAROSCOPIC BILATERAL INGUINAL HERNIA REPAIR (Bilateral) INSERTION OF MESH (Bilateral)  Patient Location: PACU  Anesthesia Type: General  Level of Consciousness: awake and alert   Airway and Oxygen Therapy: Patient Spontanous Breathing  Post-op Pain: mild  Post-op Assessment: Post-op Vital signs reviewed, Patient's Cardiovascular Status Stable, Respiratory Function Stable, Patent Airway and No signs of Nausea or vomiting  Post-op Vital Signs: stable  Complications: No apparent anesthesia complications

## 2011-07-24 NOTE — Discharge Instructions (Signed)
HERNIA REPAIR: POST OP INSTRUCTIONS  1. DIET: Follow a light bland diet the first 24 hours after arrival home, such as soup, liquids, crackers, etc.  Be sure to include lots of fluids daily.  Avoid fast food or heavy meals as your are more likely to get nauseated.  Eat a low fat the next few days after surgery. 2. Take your usually prescribed home medications unless otherwise directed. 3. PAIN CONTROL: a. Pain is best controlled by a usual combination of three different methods TOGETHER: i. Ice/Heat ii. Over the counter pain medication iii. Prescription pain medication b. Most patients will experience some swelling and bruising around the hernia(s) such as the bellybutton, groins, or old incisions.  Ice packs or heating pads (30-60 minutes up to 6 times a day) will help. Use ice for the first few days to help decrease swelling and bruising, then switch to heat to help relax tight/sore spots and speed recovery.  Some people prefer to use ice alone, heat alone, alternating between ice & heat.  Experiment to what works for you.  Swelling and bruising can take several weeks to resolve.   c. It is helpful to take an over-the-counter pain medication regularly for the first few weeks.  Choose one of the following that works best for you: i. Naproxen (Aleve, etc)  Two 220mg tabs twice a day ii. Ibuprofen (Advil, etc) Three 200mg tabs four times a day (every meal & bedtime) iii. Acetaminophen (Tylenol, etc) 325-650mg four times a day (every meal & bedtime) d. A  prescription for pain medication should be given to you upon discharge.  Take your pain medication as prescribed.  i. If you are having problems/concerns with the prescription medicine (does not control pain, nausea, vomiting, rash, itching, etc), please call us [phone] to see if we need to switch you to a different pain medicine that will work better for you and/or control your side effect better. ii. If you need a refill on your pain  medication, please contact your pharmacy.  They will contact our office to request authorization. Prescriptions will not be filled after 5 pm or on week-ends. 4. Avoid getting constipated.  Between the surgery and the pain medications, it is common to experience some constipation.  Increasing fluid intake and taking a fiber supplement (such as Metamucil, Citrucel, FiberCon, MiraLax, etc) 1-2 times a day regularly will usually help prevent this problem from occurring.  A mild laxative (prune juice, Milk of Magnesia, MiraLax, etc) should be taken according to package directions if there are no bowel movements after 48 hours.   5. Wash / shower every day.  You may shower over the dressings as they are waterproof.   6. Remove your waterproof bandages 5 days after surgery.  You may leave the incision open to air.  You may replace a dressing/Band-Aid to cover the incision for comfort if you wish.  Continue to shower over incision(s) after the dressing is off.    7. ACTIVITIES as tolerated:   a. You may resume regular (light) daily activities beginning the next day-such as daily self-care, walking, climbing stairs-gradually increasing activities as tolerated.  If you can walk 30 minutes without difficulty, it is safe to try more intense activity such as jogging, treadmill, bicycling, low-impact aerobics, swimming, etc. b. Save the most intensive and strenuous activity for last such as sit-ups, heavy lifting, contact sports, etc  Refrain from any heavy lifting or straining until you are off narcotics for pain control.     c. DO NOT PUSH THROUGH PAIN.  Let pain be your guide: If it hurts to do something, don't do it.  Pain is your body warning you to avoid that activity for another week until the pain goes down. d. You may drive when you are no longer taking prescription pain medication, you can comfortably wear a seatbelt, and you can safely maneuver your car and apply brakes. e. You may have sexual intercourse  when it is comfortable.  8. FOLLOW UP in our office a. Please call [facility] at [phone] to set up an appointment to see your surgeon in the office for a follow-up appointment approximately 2-3 weeks after your surgery. b. Make sure that you call for this appointment the day you arrive home to insure a convenient appointment time. 9.  IF YOU HAVE DISABILITY OR FAMILY LEAVE FORMS, BRING THEM TO THE OFFICE FOR PROCESSING.  DO NOT GIVE THEM TO YOUR DOCTOR.  WHEN TO CALL US [phone]: 1. Poor pain control 2. Reactions / problems with new medications (rash/itching, nausea, etc)  3. Fever over 101.5 F (38.5 C) 4. Inability to urinate 5. Nausea and/or vomiting 6. Worsening swelling or bruising 7. Continued bleeding from incision. 8. Increased pain, redness, or drainage from the incision   The clinic staff is available to answer your questions during regular business hours (8:30am-5pm).  Please don't hesitate to call and ask to speak to one of our nurses for clinical concerns.   If you have a medical emergency, go to the nearest emergency room or call 911.  A surgeon from [facility] is always on call at the hospitals in [place]  [facility] [address], [address], [place], [place]  [address] ?  [address] [address], [place], [place]   [address] MAIN: [phone] ? TOLL FREE: [phone] ? FAX: [phone] [email or web address]  

## 2011-07-24 NOTE — Transfer of Care (Signed)
Immediate Anesthesia Transfer of Care Note  Patient: Cody Huber  Procedure(s) Performed: Procedure(s) (LRB): LAPAROSCOPIC BILATERAL INGUINAL HERNIA REPAIR (Bilateral) INSERTION OF MESH (Bilateral)  Patient Location: PACU  Anesthesia Type: General  Level of Consciousness: sedated  Airway & Oxygen Therapy: Patient Spontanous Breathing and Patient connected to face mask oxygen  Post-op Assessment: Report given to PACU RN and Post -op Vital signs reviewed and stable  Post vital signs: Reviewed and stable  Complications: No apparent anesthesia complications

## 2011-07-24 NOTE — Anesthesia Procedure Notes (Signed)
Procedure Name: Intubation Date/Time: 07/24/2011 7:26 AM Performed by: Joycie Peek Pre-anesthesia Checklist: Patient identified, Timeout performed, Emergency Drugs available, Suction available and Patient being monitored Patient Re-evaluated:Patient Re-evaluated prior to inductionOxygen Delivery Method: Circle system utilized Preoxygenation: Pre-oxygenation with 100% oxygen Intubation Type: IV induction Laryngoscope Size: Mac and 4 Grade View: Grade I Tube type: Oral Tube size: 7.5 mm Number of attempts: 1 Airway Equipment and Method: Stylet Placement Confirmation: ETT inserted through vocal cords under direct vision,  breath sounds checked- equal and bilateral and positive ETCO2 Secured at: 22 cm Tube secured with: Tape Dental Injury: Teeth and Oropharynx as per pre-operative assessment

## 2011-07-25 ENCOUNTER — Telehealth (INDEPENDENT_AMBULATORY_CARE_PROVIDER_SITE_OTHER): Payer: Self-pay | Admitting: Surgery

## 2011-07-25 NOTE — Telephone Encounter (Signed)
Patient made aware it depends on the work he does to when he can return to work. He is aware of no lifting, pushing, pulling over 15 lbs until he is 6 weeks out and he will call if he needs a note to return to work. He also asked about some irritation from where he was shaved for surgery. I advised to just keep clean and dry and not to put neosporin or ointments on this. He will call back with any other questions.

## 2011-07-27 ENCOUNTER — Encounter (HOSPITAL_COMMUNITY): Payer: Self-pay | Admitting: Surgery

## 2011-08-13 ENCOUNTER — Ambulatory Visit (INDEPENDENT_AMBULATORY_CARE_PROVIDER_SITE_OTHER): Payer: PRIVATE HEALTH INSURANCE | Admitting: Surgery

## 2011-08-13 ENCOUNTER — Encounter (INDEPENDENT_AMBULATORY_CARE_PROVIDER_SITE_OTHER): Payer: Self-pay | Admitting: Surgery

## 2011-08-13 VITALS — BP 130/78 | HR 68 | Temp 97.8°F | Resp 16 | Ht 71.0 in | Wt 236.2 lb

## 2011-08-13 DIAGNOSIS — K458 Other specified abdominal hernia without obstruction or gangrene: Secondary | ICD-10-CM | POA: Insufficient documentation

## 2011-08-13 DIAGNOSIS — K4020 Bilateral inguinal hernia, without obstruction or gangrene, not specified as recurrent: Secondary | ICD-10-CM

## 2011-08-13 NOTE — Patient Instructions (Signed)

## 2011-08-13 NOTE — Progress Notes (Signed)
Subjective:     Patient ID: Cody Huber, male   DOB: 11-16-1960, 51 y.o.   MRN: [id number]  HPI  [name]  11-14-1960 [id number]  Patient Care Team: Agapito Games, MD as PCP - General  This patient is a 51 y.o.male who presents today for surgical evaluation.   Procedure: Laparoscopic repair of bilateral inguinal hernias with mesh  The patient comes today feeling well. Bruising gone. Mild sensitivity at his bellybutton. However it is nearly resolved. Otherwise feeling good. No fevers or chills. No constipation. More active. No fevers or chills.  Patient Active Problem List  Diagnoses  . HYPERLIPIDEMIA  . Bilateral inguinal hernia (R>>L)  . Diastasis recti  . Obturator hernia, right    Past Medical History  Diagnosis Date  . Hyperlipidemia   . Nasal congestion   . Right inguinal hernia   . Elevated cholesterol   . Eczema     Past Surgical History  Procedure Date  . Hemorrhoid surgery 2006 - approximate  . Inguinal hernia repair 07/24/2011    Procedure: LAPAROSCOPIC BILATERAL INGUINAL HERNIA REPAIR;  Surgeon: Ardeth Sportsman, MD;  Location: [facility];  Service: General;  Laterality: Bilateral;    History   Social History  . Marital Status: Married    Spouse Name: Misty Stanley    Number of Children: 0  . Years of Education: BS   Occupational History  . Asst Manager     [employer]   Social History Main Topics  . Smoking status: Former Smoker    Quit date: 05/21/1984  . Smokeless tobacco: Never Used  . Alcohol Use: 1.0 oz/week    2 drink(s) per week     5-6 beers per week  . Drug Use: No  . Sexually Active: Yes -- Male partner(s)   Other Topics Concern  . Not on file   Social History Narrative   Regular exercise. Walking 6 days per week.     Family History  Problem Relation Age of Onset  . Diabetes Father   . Melanoma Father   . Hyperlipidemia Father   . Hypertension Mother     Current outpatient prescriptions:atorvastatin (LIPITOR) 20 MG  tablet, , Disp: , Rfl: ;  cetirizine (ZYRTEC) 10 MG tablet, Take 10 mg by mouth as needed. , Disp: , Rfl: ;  fenofibrate micronized (LOFIBRA) 200 MG capsule, take 1 capsule by mouth at bedtime, Disp: 30 capsule, Rfl: 6;  fish oil-omega-3 fatty acids 1000 MG capsule, Take 3 g by mouth at bedtime. , Disp: , Rfl:  niacinamide 500 MG tablet, Take 500 mg by mouth 2 (two) times daily with a meal.  , Disp: , Rfl: ;  Psyllium (METAMUCIL PO), Take by mouth at bedtime. Patient takes 1 tablespoonful per day, Disp: , Rfl: ;  triamcinolone ointment (KENALOG) 0.5 %, Apply 1 application topically 2 (two) times daily. Patient uses 1 time per week as needed, Disp: , Rfl:   No Known Allergies  BP 130/78  Pulse 68  Temp(Src) 97.8 F (36.6 C) (Temporal)  Resp 16  Ht 5\' 11"  (1.803 m)  Wt 236 lb 3.2 oz (107.14 kg)  BMI 32.94 kg/m2     Review of Systems  Constitutional: Negative for fever, chills and diaphoresis.  HENT: Negative for sore throat, trouble swallowing and neck pain.   Eyes: Negative for photophobia and visual disturbance.  Respiratory: Negative for choking and shortness of breath.   Cardiovascular: Negative for chest pain and palpitations.  Gastrointestinal: Negative for nausea, vomiting, constipation,  blood in stool, abdominal distention, anal bleeding and rectal pain.  Genitourinary: Negative for dysuria, urgency, difficulty urinating and testicular pain.  Musculoskeletal: Negative for myalgias, arthralgias and gait problem.  Skin: Negative for color change and rash.  Neurological: Negative for dizziness, speech difficulty, weakness and numbness.  Hematological: Negative for adenopathy.  Psychiatric/Behavioral: Negative for hallucinations, confusion and agitation.       Objective:   Physical Exam  Constitutional: He is oriented to person, place, and time. He appears well-developed and well-nourished. No distress.  HENT:  Head: Normocephalic.  Mouth/Throat: Oropharynx is clear and moist.  No oropharyngeal exudate.  Eyes: Conjunctivae and EOM are normal. Pupils are equal, round, and reactive to light. No scleral icterus.  Neck: Normal range of motion. No tracheal deviation present.  Cardiovascular: Normal rate, normal heart sounds and intact distal pulses.   Pulmonary/Chest: Effort normal. No respiratory distress.  Abdominal: Soft. He exhibits no distension. There is no tenderness. Hernia confirmed negative in the right inguinal area and confirmed negative in the left inguinal area.       Incisions clean with normal healing ridges.  No hernias  Musculoskeletal: Normal range of motion. He exhibits no tenderness.  Neurological: He is alert and oriented to person, place, and time. No cranial nerve deficit. He exhibits normal muscle tone. Coordination normal.  Skin: Skin is warm and dry. No rash noted. He is not diaphoretic.  Psychiatric: He has a normal mood and affect. His behavior is normal.       Assessment:     3 weeks s/p bilateral & right obturator hernia repairs, recovering well    Plan:     Increase activity as tolerated.  Do not push through pain.  Advanced on diet as tolerated. Bowel regimen to avoid problems.  Return to clinic p.r.n. The patient expressed understanding and appreciation

## 2011-09-27 ENCOUNTER — Other Ambulatory Visit: Payer: Self-pay | Admitting: Family Medicine

## 2011-11-28 ENCOUNTER — Other Ambulatory Visit: Payer: Self-pay | Admitting: Family Medicine

## 2012-01-29 ENCOUNTER — Other Ambulatory Visit: Payer: Self-pay | Admitting: Family Medicine

## 2012-02-15 ENCOUNTER — Telehealth: Payer: Self-pay | Admitting: *Deleted

## 2012-02-15 DIAGNOSIS — J309 Allergic rhinitis, unspecified: Secondary | ICD-10-CM

## 2012-02-15 NOTE — Telephone Encounter (Signed)
Pt is asking if you can recommend an allergist. States he is having issues with his allergies and wants to see someone.

## 2012-02-15 NOTE — Telephone Encounter (Signed)
Pt informed

## 2012-02-15 NOTE — Telephone Encounter (Signed)
I will place a referral to [place] [facility] for him.

## 2012-03-26 ENCOUNTER — Ambulatory Visit (INDEPENDENT_AMBULATORY_CARE_PROVIDER_SITE_OTHER): Payer: PRIVATE HEALTH INSURANCE | Admitting: Family Medicine

## 2012-03-26 DIAGNOSIS — Z23 Encounter for immunization: Secondary | ICD-10-CM

## 2012-03-26 NOTE — Progress Notes (Signed)
Tolerated w/out complications

## 2012-05-06 ENCOUNTER — Encounter: Payer: PRIVATE HEALTH INSURANCE | Admitting: Family Medicine

## 2012-05-06 ENCOUNTER — Telehealth: Payer: Self-pay | Admitting: *Deleted

## 2012-05-06 DIAGNOSIS — Z0000 Encounter for general adult medical examination without abnormal findings: Secondary | ICD-10-CM

## 2012-05-06 NOTE — Telephone Encounter (Signed)
Patient called and want to do lab work before his physical  Not sure what to order for him

## 2012-05-06 NOTE — Telephone Encounter (Signed)
Labs entered and pt notified.

## 2012-05-06 NOTE — Telephone Encounter (Signed)
CMP, Lipids panel. PSA. Use V70.0 code

## 2012-05-08 ENCOUNTER — Encounter: Payer: PRIVATE HEALTH INSURANCE | Admitting: Sports Medicine

## 2012-05-08 LAB — LIPID PANEL
Cholesterol: 184 mg/dL (ref 0–200)
HDL: 37 mg/dL — ABNORMAL LOW (ref >39)
LDL Cholesterol: 126 mg/dL — ABNORMAL HIGH (ref 0–99)
Total CHOL/HDL Ratio: 5 Ratio
Triglycerides: 105 mg/dL (ref <150)
VLDL: 21 mg/dL (ref 0–40)

## 2012-05-08 LAB — COMPLETE METABOLIC PANEL WITH GFR
ALT: 43 U/L (ref 0–53)
AST: 31 U/L (ref 0–37)
Albumin: 5.1 g/dL (ref 3.5–5.2)
Alkaline Phosphatase: 45 U/L (ref 39–117)
BUN: 12 mg/dL (ref 6–23)
CO2: 27 mEq/L (ref 19–32)
Calcium: 9.6 mg/dL (ref 8.4–10.5)
Chloride: 106 mEq/L (ref 96–112)
Creat: 1.11 mg/dL (ref 0.50–1.35)
GFR, Est African American: 88 mL/min
GFR, Est Non African American: 76 mL/min
Glucose, Bld: 96 mg/dL (ref 70–99)
Potassium: 4.8 mEq/L (ref 3.5–5.3)
Sodium: 140 mEq/L (ref 135–145)
Total Bilirubin: 1.1 mg/dL (ref 0.3–1.2)
Total Protein: 6.9 g/dL (ref 6.0–8.3)

## 2012-05-08 LAB — PSA: PSA: 0.67 ng/mL (ref <=4.00)

## 2012-05-20 ENCOUNTER — Encounter: Payer: Self-pay | Admitting: Family Medicine

## 2012-05-20 ENCOUNTER — Ambulatory Visit (INDEPENDENT_AMBULATORY_CARE_PROVIDER_SITE_OTHER): Payer: PRIVATE HEALTH INSURANCE | Admitting: Family Medicine

## 2012-05-20 VITALS — BP 124/76 | HR 74 | Temp 97.8°F | Resp 14 | Ht 71.0 in | Wt 231.0 lb

## 2012-05-20 DIAGNOSIS — Z0000 Encounter for general adult medical examination without abnormal findings: Secondary | ICD-10-CM

## 2012-05-20 DIAGNOSIS — J309 Allergic rhinitis, unspecified: Secondary | ICD-10-CM | POA: Insufficient documentation

## 2012-05-20 NOTE — Progress Notes (Signed)
Subjective:    Patient ID: Cody Huber, male    DOB: Aug 22, 1960, 51 y.o.   MRN: [id number]  HPI Her for CPE today. No complaints.  He has had hernia surgery that went well back in April.  He has also been to [facility] for his allergies. He did allergy testing and says he was allergic to "everything". In fact she's starting immunotherapy in the next couple weeks to see if he gets some relief. He is currently off of his allergy medications.   Review of Systems BP 124/76  Pulse 74  Temp 97.8 F (36.6 C)  Resp 14  Ht 5\' 11"  (1.803 m)  Wt 231 lb (104.781 kg)  BMI 32.22 kg/m2  SpO2 98%    No Known Allergies  Past Medical History  Diagnosis Date  . Hyperlipidemia   . Nasal congestion   . Right inguinal hernia   . Elevated cholesterol   . Eczema     Past Surgical History  Procedure Date  . Hemorrhoid surgery 2006 - approximate  . Inguinal hernia repair 07/24/2011    Procedure: LAPAROSCOPIC BILATERAL INGUINAL HERNIA REPAIR;  Surgeon: Ardeth Sportsman, MD;  Location: [facility];  Service: General;  Laterality: Bilateral;    History   Social History  . Marital Status: Married    Spouse Name: Misty Stanley    Number of Children: 0  . Years of Education: BS   Occupational History  . Asst Manager     [employer]   Social History Main Topics  . Smoking status: Former Smoker    Quit date: 05/21/1984  . Smokeless tobacco: Never Used  . Alcohol Use: 1.0 oz/week    2 drink(s) per week     Comment: 5-6 beers per week  . Drug Use: No  . Sexually Active: Yes -- Male partner(s)   Other Topics Concern  . Not on file   Social History Narrative   Regular exercise. Walking 6 days per week.     Family History  Problem Relation Age of Onset  . Diabetes Father   . Melanoma Father   . Hyperlipidemia Father   . Hypertension Mother   . Heart disease Father 83    Outpatient Encounter Prescriptions as of 05/20/2012  Medication Sig Dispense Refill  . atorvastatin (LIPITOR) 20 MG  tablet take 1 tablet by mouth once daily  30 tablet  3  . fenofibrate micronized (LOFIBRA) 200 MG capsule take 1 capsule by mouth at bedtime  30 capsule  6  . fish oil-omega-3 fatty acids 1000 MG capsule Take 3 g by mouth at bedtime.       . niacinamide 500 MG tablet Take 500 mg by mouth 2 (two) times daily with a meal.       . Psyllium (METAMUCIL PO) Take by mouth at bedtime. Patient takes 1 tablespoonful per day      . [DISCONTINUED] atorvastatin (LIPITOR) 20 MG tablet       . [DISCONTINUED] cetirizine (ZYRTEC) 10 MG tablet Take 10 mg by mouth as needed.               Objective:   Physical Exam  Constitutional: He is oriented to person, place, and time. He appears well-developed and well-nourished.  HENT:  Head: Normocephalic and atraumatic.  Right Ear: External ear normal.  Left Ear: External ear normal.  Nose: Nose normal.  Mouth/Throat: Oropharynx is clear and moist.  Eyes: Conjunctivae normal and EOM are normal. Pupils are equal, round, and reactive to  light.  Neck: Normal range of motion. Neck supple. No thyromegaly present.  Cardiovascular: Normal rate, regular rhythm, normal heart sounds and intact distal pulses.        No carotid bruits.  Pulmonary/Chest: Effort normal and breath sounds normal.  Abdominal: Soft. Bowel sounds are normal. He exhibits no distension and no mass. There is no tenderness. There is no rebound and no guarding.  Musculoskeletal: Normal range of motion.  Lymphadenopathy:    He has no cervical adenopathy.  Neurological: He is alert and oriented to person, place, and time. He has normal reflexes.  Skin: Skin is warm and dry.  Psychiatric: He has a normal mood and affect. His behavior is normal. Judgment and thought content normal.          Assessment & Plan:  CPE Keep up a regular exercise program and make sure you are eating a healthy diet Try to eat 4 servings of dairy a day, or if you are lactose intolerant take a calcium with vitamin D  daily.  Your vaccines are up to date.   Hyperlipidemia-he has no known history of heart disease. No premature heart disease in the family. He is a former smoker but quit.  He is already had lab work and I reviewed this with him. His LDL is up even that his statin has not changed. He says he is taking it very consistently. But he admits he has been eating a little differently over the holidays. He would like to recheck his levels in about 6 months and that's fine. He can call the office in June and we can recheck his levels at that time. I also discussed with him the additional medications that he takes including niacin and fenofibrate. Based on the new guidelines it is questionable whether not he should continue these. For now he wants to continue them and then discuss again in one year at his followup.

## 2012-05-30 ENCOUNTER — Other Ambulatory Visit: Payer: Self-pay | Admitting: Family Medicine

## 2012-07-08 ENCOUNTER — Other Ambulatory Visit: Payer: Self-pay | Admitting: Family Medicine

## 2012-07-08 ENCOUNTER — Other Ambulatory Visit: Payer: Self-pay | Admitting: *Deleted

## 2012-07-08 MED ORDER — FENOFIBRATE MICRONIZED 200 MG PO CAPS: 200.0000 mg | ORAL_CAPSULE | Freq: Every day | ORAL | Status: DC

## 2012-10-07 ENCOUNTER — Other Ambulatory Visit: Payer: Self-pay | Admitting: Family Medicine

## 2013-02-01 ENCOUNTER — Other Ambulatory Visit: Payer: Self-pay | Admitting: Family Medicine

## 2013-02-06 ENCOUNTER — Other Ambulatory Visit: Payer: Self-pay | Admitting: *Deleted

## 2013-02-06 MED ORDER — ATORVASTATIN CALCIUM 20 MG PO TABS: ORAL_TABLET | ORAL | Status: DC

## 2013-02-06 MED ORDER — FENOFIBRATE MICRONIZED 200 MG PO CAPS: ORAL_CAPSULE | ORAL | Status: DC

## 2013-02-09 ENCOUNTER — Other Ambulatory Visit: Payer: Self-pay | Admitting: *Deleted

## 2013-02-09 DIAGNOSIS — E785 Hyperlipidemia, unspecified: Secondary | ICD-10-CM

## 2013-02-11 ENCOUNTER — Encounter: Payer: Self-pay | Admitting: Family Medicine

## 2013-02-11 ENCOUNTER — Ambulatory Visit (INDEPENDENT_AMBULATORY_CARE_PROVIDER_SITE_OTHER): Payer: PRIVATE HEALTH INSURANCE | Admitting: Family Medicine

## 2013-02-11 DIAGNOSIS — Z23 Encounter for immunization: Secondary | ICD-10-CM

## 2013-02-11 NOTE — Progress Notes (Signed)
i was present for all necessary aspects of todays encounter

## 2013-02-25 LAB — LIPID PANEL
Cholesterol: 152 mg/dL (ref 0–200)
HDL: 37 mg/dL — ABNORMAL LOW (ref >39)
LDL Cholesterol: 90 mg/dL (ref 0–99)
Total CHOL/HDL Ratio: 4.1 Ratio
Triglycerides: 126 mg/dL (ref <150)
VLDL: 25 mg/dL (ref 0–40)

## 2013-06-02 ENCOUNTER — Encounter: Payer: PRIVATE HEALTH INSURANCE | Admitting: Family Medicine

## 2013-06-03 ENCOUNTER — Telehealth: Payer: Self-pay | Admitting: *Deleted

## 2013-06-03 NOTE — Telephone Encounter (Signed)
Pt called and wanted to know if he should still be seen for his CPE tomorrow since he was experiencing cold sxs. I told him that if he felt as if he could not physically make it to his appt then he should reschedule. He stated that its not as bad as it was and he will keep appt.Loralee PacasBarkley, Vanna Shavers [place]

## 2013-06-04 ENCOUNTER — Ambulatory Visit (INDEPENDENT_AMBULATORY_CARE_PROVIDER_SITE_OTHER): Payer: PRIVATE HEALTH INSURANCE | Admitting: Family Medicine

## 2013-06-04 ENCOUNTER — Encounter: Payer: Self-pay | Admitting: Family Medicine

## 2013-06-04 VITALS — BP 129/86 | HR 77 | Temp 97.9°F | Ht 71.0 in | Wt 236.0 lb

## 2013-06-04 DIAGNOSIS — Z8249 Family history of ischemic heart disease and other diseases of the circulatory system: Secondary | ICD-10-CM

## 2013-06-04 DIAGNOSIS — L723 Sebaceous cyst: Secondary | ICD-10-CM

## 2013-06-04 DIAGNOSIS — Z1211 Encounter for screening for malignant neoplasm of colon: Secondary | ICD-10-CM

## 2013-06-04 DIAGNOSIS — Z0000 Encounter for general adult medical examination without abnormal findings: Secondary | ICD-10-CM

## 2013-06-04 NOTE — Patient Instructions (Signed)
Keep up a regular exercise program and make sure you are eating a healthy diet Try to eat 4 servings of dairy a day, or if you are lactose intolerant take a calcium with vitamin D daily.  Your vaccines are up to date.   

## 2013-06-04 NOTE — Progress Notes (Signed)
Subjective:    Patient ID: Cody Huber, male    DOB: May 30, 1960, 53 y.o.   MRN: [id number]  HPI Here for CPE - Due for colonoscopy. t was 10 years ago.  Lab Results  Component Value Date   CHOL 152 02/25/2013   HDL 37* 02/25/2013   LDLCALC 90 02/25/2013   TRIG 126 02/25/2013   CHOLHDL 4.1 02/25/2013      Review of Systems Comprehensive review of systems is negative. He does have a spot on his back that he wants me to look at today. As well as a spot on his left cheek. The place on his back with a sebaceous cyst. Inflamed and had it removed several years ago with a local dermatology office. He feels like the lesion is coming back but he has not had any pain, fever or erythema or recent swelling. The place on his left cheek says he notices gets irritated and inflamed when he grows his beard out. Right now it looks okay but he just wants to make sure it doesn't look like any type of skin cancer.  BP 129/86  Pulse 77  Temp(Src) 97.9 F (36.6 C)  Ht 5\' 11"  (1.803 m)  Wt 236 lb (107.049 kg)  BMI 32.93 kg/m2    No Known Allergies  Past Medical History  Diagnosis Date  . Hyperlipidemia   . Nasal congestion   . Right inguinal hernia   . Elevated cholesterol   . Eczema     Past Surgical History  Procedure Laterality Date  . Hemorrhoid surgery  2006 - approximate  . Inguinal hernia repair  07/24/2011    Procedure: LAPAROSCOPIC BILATERAL INGUINAL HERNIA REPAIR;  Surgeon: Ardeth SportsmanSteven C. Gross, MD;  Location: [facility];  Service: General;  Laterality: Bilateral;    History   Social History  . Marital Status: Married    Spouse Name: Misty StanleyLisa    Number of Children: 0  . Years of Education: BS   Occupational History  . Asst Manager     [employer]   Social History Main Topics  . Smoking status: Former Smoker    Quit date: 05/21/1984  . Smokeless tobacco: Never Used  . Alcohol Use: 1.0 oz/week    2 drink(s) per week     Comment: 5-6 beers per week  . Drug Use: No  . Sexual Activity:  Yes    Partners: Female   Other Topics Concern  . Not on file   Social History Narrative   Regular exercise. Walking 6 days per week.     Family History  Problem Relation Age of Onset  . Diabetes Father   . Melanoma Father   . Hyperlipidemia Father   . Hypertension Mother   . Heart disease Father 6970  . Aneurysm Mother [id number]    Outpatient Encounter Prescriptions as of 06/04/2013  Medication Sig  . atorvastatin (LIPITOR) 20 MG tablet take 1 tablet by mouth once daily  . fenofibrate micronized (LOFIBRA) 200 MG capsule take 1 capsule by mouth at bedtime  . fish oil-omega-3 fatty acids 1000 MG capsule Take 3 g by mouth at bedtime.   . niacinamide 500 MG tablet Take 500 mg by mouth 2 (two) times daily with a meal.   . Psyllium (METAMUCIL PO) Take by mouth at bedtime. Patient takes 1 tablespoonful per day            Objective:   Physical Exam  Constitutional: He is oriented to person, place, and time. He appears well-developed  and well-nourished.  HENT:  Head: Normocephalic and atraumatic.  Right Ear: External ear normal.  Left Ear: External ear normal.  Nose: Nose normal.  Mouth/Throat: Oropharynx is clear and moist.  Eyes: Conjunctivae and EOM are normal. Pupils are equal, round, and reactive to light.  Neck: Normal range of motion. Neck supple. No thyromegaly present.  Cardiovascular: Normal rate, regular rhythm, normal heart sounds and intact distal pulses.   No carotid or abdominal bruits.  Pulmonary/Chest: Effort normal and breath sounds normal.  Abdominal: Soft. Bowel sounds are normal. He exhibits no distension and no mass. There is no tenderness. There is no rebound and no guarding.  Musculoskeletal: Normal range of motion.  Lymphadenopathy:    He has no cervical adenopathy.  Neurological: He is alert and oriented to person, place, and time. He has normal reflexes.  Skin: Skin is warm and dry.  Psychiatric: He has a normal mood and affect. His behavior is normal.  Judgment and thought content normal.          Assessment & Plan:  CPE -  Keep up a regular exercise program and make sure you are eating a healthy diet Try to eat 4 servings of dairy a day, or if you are lactose intolerant take a calcium with vitamin D daily.  Your vaccines are up to date.   I do not see a specific lesion on his left cheek. He does have some small broken blood vessels but no actual lesion. Suspect that he may be seeing some folliculitis we'll decrease his beard out.  Sebaceous cyst on right upper back near his shoulder-recommend reexcision. To him that sometimes these do not completely resolve and will come back. We are happy to do that here in our office or he can schedule with his rheumatologist if he would like.  Family history of abdominal aortic aneurysm. Mom was diagnosed around age 23 and did require surgical repair. He wants to know if he should be evaluated for screening. Based on current guidelines since he was a smoker in college he should be screened at age 39. I will check the guidelines to see if they're any different recommendations if there is a family history.  Check PSA and CMP today. Lipid panel is up-to-date.

## 2013-06-16 LAB — COMPLETE METABOLIC PANEL WITH GFR
ALT: 45 U/L (ref 0–53)
AST: 30 U/L (ref 0–37)
Albumin: 4.5 g/dL (ref 3.5–5.2)
Alkaline Phosphatase: 44 U/L (ref 39–117)
BUN: 13 mg/dL (ref 6–23)
CO2: 29 mEq/L (ref 19–32)
Calcium: 9.3 mg/dL (ref 8.4–10.5)
Chloride: 105 mEq/L (ref 96–112)
Creat: 1.01 mg/dL (ref 0.50–1.35)
GFR, Est African American: >89 mL/min
GFR, Est Non African American: 85 mL/min
Glucose, Bld: 94 mg/dL (ref 70–99)
Potassium: 4.4 mEq/L (ref 3.5–5.3)
Sodium: 139 mEq/L (ref 135–145)
Total Bilirubin: 0.9 mg/dL (ref 0.3–1.2)
Total Protein: 6.6 g/dL (ref 6.0–8.3)

## 2013-06-17 LAB — PSA: PSA: 0.59 ng/mL (ref <=4.00)

## 2013-06-17 NOTE — Progress Notes (Signed)
Quick Note:  All labs are normal. ______ 

## 2013-07-03 ENCOUNTER — Other Ambulatory Visit: Payer: Self-pay | Admitting: Family Medicine

## 2013-07-26 ENCOUNTER — Encounter: Payer: Self-pay | Admitting: Emergency Medicine

## 2013-07-26 ENCOUNTER — Emergency Department (INDEPENDENT_AMBULATORY_CARE_PROVIDER_SITE_OTHER)
Admission: EM | Admit: 2013-07-26 | Discharge: 2013-07-26 | Disposition: A | Discharge status: Home / Self Care | Payer: PRIVATE HEALTH INSURANCE | Source: Home / Self Care | Attending: Family Medicine | Admitting: Family Medicine

## 2013-07-26 DIAGNOSIS — R6889 Other general symptoms and signs: Secondary | ICD-10-CM

## 2013-07-26 LAB — POCT INFLUENZA A/B
Influenza A, POC: NEGATIVE
Influenza B, POC: NEGATIVE

## 2013-07-26 MED ORDER — OSELTAMIVIR PHOSPHATE 75 MG PO CAPS: 75.0000 mg | ORAL_CAPSULE | Freq: Two times a day (BID) | ORAL | Status: DC

## 2013-07-26 MED ORDER — AZITHROMYCIN 250 MG PO TABS: ORAL_TABLET | ORAL | Status: DC

## 2013-07-26 NOTE — ED Provider Notes (Signed)
CSN: [id number]     Arrival date & time 07/26/13  1151 History   First MD Initiated Contact with Patient 07/26/13 1203     Chief Complaint  Patient presents with  . Fever  . Generalized Body Aches  . Cough    HPI  URI Symptoms Onset: 2 days  Description: fever body aches, chills, cough  Modifying factors:  + flu contact at work   Symptoms Nasal discharge: yes Fever: yes Sore throat: no Cough: yes Wheezing: no Ear pain: no GI symptoms: no Sick contacts: yes  Red Flags  Stiff neck: no Dyspnea: no Rash: no Swallowing difficulty: no  Sinusitis Risk Factors Headache/face pain: no Double sickening: no tooth pain: no  Allergy Risk Factors Sneezing: no Itchy scratchy throat: no Seasonal symptoms: no  Flu Risk Factors Headache: yes muscle aches: no severe fatigue: yes   Past Medical History  Diagnosis Date  . Hyperlipidemia   . Nasal congestion   . Right inguinal hernia   . Elevated cholesterol   . Eczema    Past Surgical History  Procedure Laterality Date  . Hemorrhoid surgery  2006 - approximate  . Inguinal hernia repair  07/24/2011    Procedure: LAPAROSCOPIC BILATERAL INGUINAL HERNIA REPAIR;  Surgeon: Ardeth Sportsman, MD;  Location: [facility];  Service: General;  Laterality: Bilateral;   Family History  Problem Relation Age of Onset  . Diabetes Father   . Melanoma Father   . Hyperlipidemia Father   . Hypertension Mother   . Heart disease Father 80  . Aneurysm Mother 52   History  Substance Use Topics  . Smoking status: Former Smoker    Quit date: 05/21/1984  . Smokeless tobacco: Never Used  . Alcohol Use: 1.0 oz/week    2 drink(s) per week     Comment: 5-6 beers per week    Review of Systems  All other systems reviewed and are negative.    Allergies  Review of patient's allergies indicates no known allergies.  Home Medications   Current Outpatient Rx  Name  Route  Sig  Dispense  Refill  . atorvastatin (LIPITOR) 20 MG tablet      take  1 tablet by mouth once daily   30 tablet   3   . azithromycin (ZITHROMAX) 250 MG tablet      Take 2 tabs PO x 1 dose, then 1 tab PO QD x 4 days   6 tablet   0   . fenofibrate micronized (LOFIBRA) 200 MG capsule      take 1 capsule by mouth at bedtime   30 capsule   3   . fish oil-omega-3 fatty acids 1000 MG capsule   Oral   Take 3 g by mouth at bedtime.          . niacinamide 500 MG tablet   Oral   Take 500 mg by mouth 2 (two) times daily with a meal.          . oseltamivir (TAMIFLU) 75 MG capsule   Oral   Take 1 capsule (75 mg total) by mouth 2 (two) times daily.   10 capsule   0   . Psyllium (METAMUCIL PO)   Oral   Take by mouth at bedtime. Patient takes 1 tablespoonful per day          BP 146/84  Pulse 93  Temp(Src) 100.9 F (38.3 C) (Oral)  Resp 20  Ht 5\' 11"  (1.803 m)  Wt 234 lb 8  oz (106.369 kg)  BMI 32.72 kg/m2  SpO2 96% Physical Exam  Constitutional: He appears well-developed and well-nourished.  HENT:  Head: Normocephalic and atraumatic.  Right Ear: External ear normal.  Left Ear: External ear normal.  +nasal erythema, rhinorrhea bilaterally, + post oropharyngeal erythema    Eyes: Conjunctivae are normal. Pupils are equal, round, and reactive to light.  Neck: Normal range of motion. Neck supple.  Cardiovascular: Normal rate and regular rhythm.   Pulmonary/Chest: Effort normal and breath sounds normal.  Abdominal: Soft. Bowel sounds are normal.  Musculoskeletal: Normal range of motion.  Neurological: He is alert.  Skin: Skin is warm.    ED Course  Procedures (including critical care time) Labs Review Labs Reviewed  POCT INFLUENZA A/B   Imaging Review No results found.   MDM   1. Flu-like symptoms    Will place on tamiflu  Zpak for lower resp coverage.  Discussed infectious and resp red flags.  Follow up as needed.     The patient and/or caregiver has been counseled thoroughly with regard to treatment plan and/or  medications prescribed including dosage, schedule, interactions, rationale for use, and possible side effects and they verbalize understanding. Diagnoses and expected course of recovery discussed and will return if not improved as expected or if the condition worsens. Patient and/or caregiver verbalized understanding.         Doree AlbeeSteven Elena Cothern, MD 07/26/13 [phone]

## 2013-07-26 NOTE — ED Notes (Signed)
C/o fever, chills, body aches, rhinitis and cough since Friday afternoon. Patient has tried Photographerotc Allegra. 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

## 2013-08-06 ENCOUNTER — Encounter: Payer: Self-pay | Admitting: Family Medicine

## 2013-11-03 ENCOUNTER — Other Ambulatory Visit: Payer: Self-pay | Admitting: Family Medicine

## 2014-02-25 ENCOUNTER — Ambulatory Visit (INDEPENDENT_AMBULATORY_CARE_PROVIDER_SITE_OTHER): Payer: PRIVATE HEALTH INSURANCE | Admitting: Sports Medicine

## 2014-02-25 VITALS — Temp 98.3°F

## 2014-02-25 DIAGNOSIS — Z299 Encounter for prophylactic measures, unspecified: Secondary | ICD-10-CM | POA: Insufficient documentation

## 2014-02-25 DIAGNOSIS — Z418 Encounter for other procedures for purposes other than remedying health state: Secondary | ICD-10-CM

## 2014-02-25 DIAGNOSIS — Z23 Encounter for immunization: Secondary | ICD-10-CM

## 2014-02-25 NOTE — Progress Notes (Signed)
   Subjective:    Patient ID: Cody Huber, male    DOB: [birth year], 53 y.o.   MRN: [id number]  HPI  Cody Huber is here for a flu vaccine.   Review of Systems     Objective:   Physical Exam        Assessment & Plan:  Patient tolerated injection well without any complications.

## 2014-02-25 NOTE — Assessment & Plan Note (Signed)
Flu shot as above.

## 2014-02-27 ENCOUNTER — Other Ambulatory Visit: Payer: Self-pay | Admitting: Family Medicine

## 2014-03-31 ENCOUNTER — Other Ambulatory Visit: Payer: Self-pay | Admitting: Family Medicine

## 2014-04-13 ENCOUNTER — Telehealth: Payer: Self-pay

## 2014-04-13 NOTE — Telephone Encounter (Signed)
Lets do CMP as well

## 2014-04-13 NOTE — Telephone Encounter (Signed)
Can he have just lab work or will he need and office visit also.

## 2014-04-13 NOTE — Telephone Encounter (Signed)
Patient is due for cholesterol lab work. Will he also need an office visit?

## 2014-04-14 NOTE — Telephone Encounter (Signed)
Okay to do blood work now but he does need to schedule a annual wellness exam in January for his routine.

## 2014-04-22 ENCOUNTER — Other Ambulatory Visit: Payer: Self-pay

## 2014-04-22 MED ORDER — ATORVASTATIN CALCIUM 20 MG PO TABS: 20.0000 mg | ORAL_TABLET | Freq: Every day | ORAL | Status: DC

## 2014-04-22 MED ORDER — FENOFIBRATE MICRONIZED 200 MG PO CAPS: 200.0000 mg | ORAL_CAPSULE | Freq: Every day | ORAL | Status: DC

## 2014-04-22 NOTE — Telephone Encounter (Signed)
Patient has been scheduled for January. He will be in on Tuesday for labs. What CPE labs will he need?

## 2014-04-22 NOTE — Telephone Encounter (Signed)
Left message advising patient to call back to schedule.

## 2014-04-26 NOTE — Telephone Encounter (Signed)
CMP, lipid, PSA 

## 2014-04-27 ENCOUNTER — Other Ambulatory Visit: Payer: Self-pay | Admitting: *Deleted

## 2014-04-27 DIAGNOSIS — Z0000 Encounter for general adult medical examination without abnormal findings: Secondary | ICD-10-CM

## 2014-04-28 LAB — COMPLETE METABOLIC PANEL WITH GFR
ALT: 31 U/L (ref 0–53)
AST: 25 U/L (ref 0–37)
Albumin: 4.6 g/dL (ref 3.5–5.2)
Alkaline Phosphatase: 43 U/L (ref 39–117)
BUN: 12 mg/dL (ref 6–23)
CO2: 26 mEq/L (ref 19–32)
Calcium: 9.3 mg/dL (ref 8.4–10.5)
Chloride: 105 mEq/L (ref 96–112)
Creat: 0.98 mg/dL (ref 0.50–1.35)
GFR, Est African American: >89 mL/min
GFR, Est Non African American: 87 mL/min
Glucose, Bld: 95 mg/dL (ref 70–99)
Potassium: 4.2 mEq/L (ref 3.5–5.3)
Sodium: 140 mEq/L (ref 135–145)
Total Bilirubin: 0.8 mg/dL (ref 0.2–1.2)
Total Protein: 6.5 g/dL (ref 6.0–8.3)

## 2014-04-28 LAB — LIPID PANEL
Cholesterol: 161 mg/dL (ref 0–200)
HDL: 33 mg/dL — ABNORMAL LOW (ref >39)
LDL Cholesterol: 101 mg/dL — ABNORMAL HIGH (ref 0–99)
Total CHOL/HDL Ratio: 4.9 Ratio
Triglycerides: 136 mg/dL (ref <150)
VLDL: 27 mg/dL (ref 0–40)

## 2014-04-28 LAB — PSA: PSA: 0.79 ng/mL (ref <=4.00)

## 2014-06-09 ENCOUNTER — Telehealth: Payer: Self-pay | Admitting: Family Medicine

## 2014-06-09 ENCOUNTER — Encounter: Payer: Self-pay | Admitting: Family Medicine

## 2014-06-09 ENCOUNTER — Ambulatory Visit (INDEPENDENT_AMBULATORY_CARE_PROVIDER_SITE_OTHER): Payer: PRIVATE HEALTH INSURANCE | Admitting: Family Medicine

## 2014-06-09 VITALS — BP 129/85 | HR 70 | Ht 71.0 in | Wt 235.0 lb

## 2014-06-09 DIAGNOSIS — Z0000 Encounter for general adult medical examination without abnormal findings: Secondary | ICD-10-CM

## 2014-06-09 DIAGNOSIS — E785 Hyperlipidemia, unspecified: Secondary | ICD-10-CM

## 2014-06-09 DIAGNOSIS — Z8249 Family history of ischemic heart disease and other diseases of the circulatory system: Secondary | ICD-10-CM | POA: Insufficient documentation

## 2014-06-09 DIAGNOSIS — Z125 Encounter for screening for malignant neoplasm of prostate: Secondary | ICD-10-CM

## 2014-06-09 DIAGNOSIS — Z8489 Family history of other specified conditions: Secondary | ICD-10-CM

## 2014-06-09 MED ORDER — ATORVASTATIN CALCIUM 40 MG PO TABS: 40.0000 mg | ORAL_TABLET | Freq: Every day | ORAL | Status: DC

## 2014-06-09 NOTE — Patient Instructions (Signed)
Recheck lipids in 6 months. Can just call for lab slip around that time.

## 2014-06-09 NOTE — Progress Notes (Signed)
Subjective:    Patient ID: Cody Huber, male    DOB: [birth year], 54 y.o.   MRN: [id number]  HPI Here for CPE today.  No specific concerns today. He has been walking about 1 mile per day area no recent changes in exercise or activity level. He did want to go over his blood work results from December. His cluster was just mildly elevated from previous. He is Re: On Lipitor 20 mg a day. He does get vision checked yearly and his most recent lenses are up-to-date. He did want to discuss guidelines regarding prostate cancer screening. He denies any recent urinary or prostate symptoms.   Review of Systems BP 129/85 mmHg  Pulse 70  Ht 5\' 11"  (1.803 m)  Wt 235 lb (106.595 kg)  BMI 32.79 kg/m2  SpO2 99%    No Known Allergies  Past Medical History  Diagnosis Date  . Hyperlipidemia   . Nasal congestion   . Right inguinal hernia   . Elevated cholesterol   . Eczema     Past Surgical History  Procedure Laterality Date  . Hemorrhoid surgery  2006 - approximate  . Inguinal hernia repair  07/24/2011    Procedure: LAPAROSCOPIC BILATERAL INGUINAL HERNIA REPAIR;  Surgeon: Ardeth SportsmanSteven C. Gross, MD;  Location: [facility];  Service: General;  Laterality: Bilateral;    History   Social History  . Marital Status: Married    Spouse Name: Cody Huber    Number of Children: 0  . Years of Education: BS   Occupational History  . Asst Manager     [employer]   Social History Main Topics  . Smoking status: Former Smoker    Quit date: 05/21/1984  . Smokeless tobacco: Never Used  . Alcohol Use: 1.0 oz/week    2 drink(s) per week     Comment: 5-6 beers per week  . Drug Use: No  . Sexual Activity:    Partners: Female   Other Topics Concern  . Not on file   Social History Narrative   Regular exercise. Walking 6 days per week. Walks aobut a mile per day.     Family History  Problem Relation Age of Onset  . Diabetes Father   . Melanoma Father   . Hyperlipidemia Father   . Hypertension Mother   . Heart  disease Father 1770  . Aneurysm Mother 3270    Outpatient Encounter Prescriptions as of 06/09/2014  Medication Sig  . atorvastatin (LIPITOR) 40 MG tablet Take 1 tablet (40 mg total) by mouth daily.  . fenofibrate micronized (LOFIBRA) 200 MG capsule Take 1 capsule (200 mg total) by mouth at bedtime.  . fish oil-omega-3 fatty acids 1000 MG capsule Take 3 g by mouth at bedtime.   . Psyllium (METAMUCIL PO) Take by mouth at bedtime. Patient takes 1 tablespoonful per day  . [DISCONTINUED] atorvastatin (LIPITOR) 20 MG tablet Take 1 tablet (20 mg total) by mouth daily.  . [DISCONTINUED] niacinamide 500 MG tablet Take 500 mg by mouth 2 (two) times daily with a meal.           Objective:   Physical Exam  Constitutional: He is oriented to person, place, and time. He appears well-developed and well-nourished.  HENT:  Head: Normocephalic and atraumatic.  Right Ear: External ear normal.  Left Ear: External ear normal.  Nose: Nose normal.  Mouth/Throat: Oropharynx is clear and moist.  Eyes: Conjunctivae and EOM are normal. Pupils are equal, round, and reactive to light.  Neck: Normal range  of motion. Neck supple. No thyromegaly present.  Cardiovascular: Normal rate, regular rhythm, normal heart sounds and intact distal pulses.   Pulmonary/Chest: Effort normal and breath sounds normal.  Abdominal: Soft. Bowel sounds are normal. He exhibits no distension and no mass. There is no tenderness. There is no rebound and no guarding.  Musculoskeletal: Normal range of motion.  Lymphadenopathy:    He has no cervical adenopathy.  Neurological: He is alert and oriented to person, place, and time. He has normal reflexes.  Skin: Skin is warm and dry.  Psychiatric: He has a normal mood and affect. His behavior is normal. Judgment and thought content normal.          Assessment & Plan:  CPE Keep up a regular exercise program and make sure you are eating a healthy diet Try to eat 4 servings of dairy a day,  or if you are lactose intolerant take a calcium with vitamin D daily.  Your vaccines are up to date.   Hyperlipidemia - recheck chol in July since we readjusted dose today.   Family hx of aneurysm - I discussed with there is no screening guideline currently for family history of aneurysm but since he was a former smoker, [employer] does recommend a once in her lifetime screening for abdominal aortic aneurysm in men who have smoked at age 57. Did discuss the importance of reducing risk by controlling blood pressure and cholesterol and healthy lifestyle and living over his lifetime.

## 2014-06-09 NOTE — Telephone Encounter (Signed)
Cal pt and let him know [employer] does recommend a once in her lifetime screening for abdominal aortic aneurysm in men who have smoked at age 55.

## 2014-06-10 NOTE — Telephone Encounter (Signed)
Pt informed.Cody Huber  

## 2014-10-05 ENCOUNTER — Emergency Department (INDEPENDENT_AMBULATORY_CARE_PROVIDER_SITE_OTHER)
Admission: EM | Admit: 2014-10-05 | Discharge: 2014-10-05 | Disposition: A | Discharge status: Home / Self Care | Payer: PRIVATE HEALTH INSURANCE | Source: Home / Self Care | Attending: Emergency Medicine | Admitting: Emergency Medicine

## 2014-10-05 ENCOUNTER — Encounter: Payer: Self-pay | Admitting: *Deleted

## 2014-10-05 DIAGNOSIS — J209 Acute bronchitis, unspecified: Secondary | ICD-10-CM | POA: Diagnosis not present

## 2014-10-05 DIAGNOSIS — J0100 Acute maxillary sinusitis, unspecified: Secondary | ICD-10-CM | POA: Diagnosis not present

## 2014-10-05 MED ORDER — PROMETHAZINE-CODEINE 6.25-10 MG/5ML PO SYRP: ORAL_SOLUTION | ORAL | Status: DC

## 2014-10-05 MED ORDER — AZITHROMYCIN 250 MG PO TABS: ORAL_TABLET | ORAL | Status: DC

## 2014-10-05 NOTE — ED Provider Notes (Signed)
CSN: [id number]     Arrival date & time 10/05/14  0840 History   First MD Initiated Contact with Patient 10/05/14 [phone]     Chief Complaint  Patient presents with  . Cough   (Consider location/radiation/quality/duration/timing/severity/associated sxs/prior Treatment) HPI URI HISTORY  Cody Huber is a 54 y.o. male who complains of onset of head and chest congestion symptoms for several days.   Have been using over-the-counter treatment which helps minimally  No chills/sweats +  Fever  +  Nasal congestion +  Discolored Post-nasal drainage No sinus pain/pressure No sore throat  +  Cough, productive of discolored sputum. The cough is severe at night but only mild and occasional during the daytime. No wheezing Positive chest congestion No hemoptysis No shortness of breath No pleuritic pain  No itchy/red eyes No earache  No nausea No vomiting No abdominal pain No diarrhea  No skin rashes +  Fatigue No myalgias No headache   Past Medical History  Diagnosis Date  . Hyperlipidemia   . Nasal congestion   . Right inguinal hernia   . Elevated cholesterol   . Eczema    Past Surgical History  Procedure Laterality Date  . Hemorrhoid surgery  2006 - approximate  . Inguinal hernia repair  07/24/2011    Procedure: LAPAROSCOPIC BILATERAL INGUINAL HERNIA REPAIR;  Surgeon: Ardeth SportsmanSteven C. Gross, MD;  Location: [facility];  Service: General;  Laterality: Bilateral;   Family History  Problem Relation Age of Onset  . Diabetes Father   . Melanoma Father   . Hyperlipidemia Father   . Hypertension Mother   . Heart disease Father 1270  . Aneurysm Mother 6170   History  Substance Use Topics  . Smoking status: Former Smoker    Quit date: 05/21/1984  . Smokeless tobacco: Never Used  . Alcohol Use: 1.0 oz/week    2 drink(s) per week     Comment: 5-6 beers per week    Review of Systems Remainder of Review of Systems negative for acute change except as noted in the HPI.  Allergies  Review of  patient's allergies indicates no known allergies.  Home Medications   Prior to Admission medications   Medication Sig Start Date End Date Taking? Authorizing Provider  atorvastatin (LIPITOR) 40 MG tablet Take 1 tablet (40 mg total) by mouth daily. 06/09/14   Agapito Gamesatherine D Metheney, MD  azithromycin (ZITHROMAX Z-PAK) 250 MG tablet Take 2 tablets on day one, then 1 tablet daily on days 2 through 5 10/05/14   Lajean Manesavid Massey, MD  fenofibrate micronized (LOFIBRA) 200 MG capsule Take 1 capsule (200 mg total) by mouth at bedtime. 04/22/14   Agapito Gamesatherine D Metheney, MD  fish oil-omega-3 fatty acids 1000 MG capsule Take 3 g by mouth at bedtime.     Historical Provider, MD  promethazine-codeine (PHENERGAN WITH CODEINE) 6.25-10 MG/5ML syrup Take 1-2 teaspoons at night, every 4-6 hours as needed for cough. --Caution: May cause drowsiness. 10/05/14   Lajean Manesavid Massey, MD  Psyllium (METAMUCIL PO) Take by mouth at bedtime. Patient takes 1 tablespoonful per day    Historical Provider, MD   BP 126/81 mmHg  Pulse 87  Temp(Src) 98.6 F (37 C) (Oral)  Resp 18  Ht 5\' 11"  (1.803 m)  Wt 233 lb (105.688 kg)  BMI 32.51 kg/m2  SpO2 97% Physical Exam  Constitutional: He is oriented to person, place, and time. He appears well-developed and well-nourished. No distress.  HENT:  Head: Normocephalic and atraumatic.  Right Ear: Tympanic membrane, external ear  and ear canal normal.  Left Ear: Tympanic membrane, external ear and ear canal normal.  Nose: Mucosal edema and rhinorrhea present. Right sinus exhibits maxillary sinus tenderness. Left sinus exhibits maxillary sinus tenderness.  Mouth/Throat: Oropharynx is clear and moist. No oral lesions. No oropharyngeal exudate.  Eyes: Right eye exhibits no discharge. Left eye exhibits no discharge. No scleral icterus.  Neck: Neck supple.  Cardiovascular: Normal rate, regular rhythm and normal heart sounds.   Pulmonary/Chest: Effort normal. No respiratory distress. He has no wheezes. He  has rhonchi. He has no rales.  Lymphadenopathy:    He has no cervical adenopathy.  Neurological: He is alert and oriented to person, place, and time.  Skin: Skin is warm and dry.  Nursing note and vitals reviewed.   ED Course  Procedures (including critical care time) Labs Review Labs Reviewed - No data to display  Imaging Review No results found.   MDM   1. Acute bronchitis, unspecified organism   2. Acute maxillary sinusitis, recurrence not specified    Treatment options discussed, as well as risks, benefits, alternatives. Patient voiced understanding and agreement with the following plans:   New Prescriptions   AZITHROMYCIN (ZITHROMAX Z-PAK) 250 MG TABLET    Take 2 tablets on day one, then 1 tablet daily on days 2 through 5   PROMETHAZINE-CODEINE (PHENERGAN WITH CODEINE) 6.25-10 MG/5ML SYRUP    Take 1-2 teaspoons at night, every 4-6 hours as needed for cough. --Caution: May cause drowsiness.   other symptomatic care discussed Flonase Follow-up with your primary care doctor in 5-7 days if not improving, or sooner if symptoms become worse. Precautions discussed. Red flags discussed. Questions invited and answered. Patient voiced understanding and agreement.     Lajean Manesavid Massey, MD 10/05/14 [phone]

## 2014-10-05 NOTE — ED Notes (Signed)
Pt c/o head and chest congestion with productive cough x 5 days. He is unsure if he has had a fever.

## 2015-01-02 ENCOUNTER — Other Ambulatory Visit: Payer: Self-pay | Admitting: Family Medicine

## 2015-01-17 ENCOUNTER — Other Ambulatory Visit: Payer: Self-pay

## 2015-01-17 MED ORDER — FENOFIBRATE MICRONIZED 200 MG PO CAPS: 200.0000 mg | ORAL_CAPSULE | Freq: Every day | ORAL | Status: DC

## 2015-02-15 ENCOUNTER — Ambulatory Visit (INDEPENDENT_AMBULATORY_CARE_PROVIDER_SITE_OTHER): Payer: PRIVATE HEALTH INSURANCE | Admitting: Family Medicine

## 2015-02-15 VITALS — Temp 97.9°F

## 2015-02-15 DIAGNOSIS — Z23 Encounter for immunization: Secondary | ICD-10-CM

## 2015-05-31 ENCOUNTER — Other Ambulatory Visit: Payer: Self-pay | Admitting: Family Medicine

## 2015-05-31 DIAGNOSIS — Z0000 Encounter for general adult medical examination without abnormal findings: Secondary | ICD-10-CM

## 2015-06-06 MED ORDER — FENOFIBRATE MICRONIZED 200 MG PO CAPS: 200.0000 mg | ORAL_CAPSULE | Freq: Every day | ORAL | Status: DC

## 2015-06-06 NOTE — Addendum Note (Signed)
Addended by: Collie SiadICHARDSON, Olivia Pavelko M on: 06/06/2015 04:57 PM   Modules accepted: Orders

## 2015-06-09 ENCOUNTER — Telehealth: Payer: Self-pay | Admitting: *Deleted

## 2015-06-09 DIAGNOSIS — Z0000 Encounter for general adult medical examination without abnormal findings: Secondary | ICD-10-CM

## 2015-06-09 LAB — COMPLETE METABOLIC PANEL WITH GFR
ALT: 40 U/L (ref 9–46)
AST: 29 U/L (ref 10–35)
Albumin: 4.5 g/dL (ref 3.6–5.1)
Alkaline Phosphatase: 44 U/L (ref 40–115)
BUN: 12 mg/dL (ref 7–25)
CO2: 27 mmol/L (ref 20–31)
Calcium: 9.7 mg/dL (ref 8.6–10.3)
Chloride: 104 mmol/L (ref 98–110)
Creat: 0.94 mg/dL (ref 0.70–1.33)
GFR, Est African American: >89 mL/min (ref >=60)
GFR, Est Non African American: >89 mL/min (ref >=60)
Glucose, Bld: 96 mg/dL (ref 65–99)
Potassium: 4 mmol/L (ref 3.5–5.3)
Sodium: 139 mmol/L (ref 135–146)
Total Bilirubin: 0.8 mg/dL (ref 0.2–1.2)
Total Protein: 6.6 g/dL (ref 6.1–8.1)

## 2015-06-09 LAB — LIPID PANEL
Cholesterol: 144 mg/dL (ref 125–200)
HDL: 31 mg/dL — ABNORMAL LOW (ref >=40)
LDL Cholesterol: 92 mg/dL (ref <130)
Total CHOL/HDL Ratio: 4.6 Ratio (ref <=5.0)
Triglycerides: 105 mg/dL (ref <150)
VLDL: 21 mg/dL (ref <30)

## 2015-06-09 NOTE — Telephone Encounter (Signed)
Labs re-ordered

## 2015-06-10 LAB — PSA: PSA: 0.59 ng/mL (ref <=4.00)

## 2015-06-15 ENCOUNTER — Ambulatory Visit (INDEPENDENT_AMBULATORY_CARE_PROVIDER_SITE_OTHER): Payer: BLUE CROSS/BLUE SHIELD | Admitting: Family Medicine

## 2015-06-15 ENCOUNTER — Encounter: Payer: Self-pay | Admitting: Family Medicine

## 2015-06-15 VITALS — BP 132/83 | HR 74 | Temp 97.9°F | Wt 231.0 lb

## 2015-06-15 DIAGNOSIS — L309 Dermatitis, unspecified: Secondary | ICD-10-CM

## 2015-06-15 DIAGNOSIS — Z0000 Encounter for general adult medical examination without abnormal findings: Secondary | ICD-10-CM

## 2015-06-15 DIAGNOSIS — Z1159 Encounter for screening for other viral diseases: Secondary | ICD-10-CM

## 2015-06-15 DIAGNOSIS — J069 Acute upper respiratory infection, unspecified: Secondary | ICD-10-CM

## 2015-06-15 MED ORDER — CLOBETASOL PROPIONATE 0.05 % EX CREA: 1.0000 "application " | TOPICAL_CREAM | Freq: Every day | CUTANEOUS | Status: DC | PRN

## 2015-06-15 MED ORDER — FENOFIBRATE MICRONIZED 200 MG PO CAPS: 200.0000 mg | ORAL_CAPSULE | Freq: Every day | ORAL | Status: DC

## 2015-06-15 NOTE — Patient Instructions (Signed)
You can try Cetaphil, Phisoderm, or Aquaphor to moisturize. Apply immediately after patting dry after shower. May need even apply twice a day. Avoid really hot showers which overdrive the skin and remove too much skin oil.

## 2015-06-15 NOTE — Progress Notes (Signed)
   Subjective:    Patient ID: Cody Huber, male    DOB: 1960-08-17, 55 y.o.   MRN: [id number]  HPI Here for complete physical today. He has had a head cold for about 5 days. He's had some sinus pressure and nasal congestion. No fevers chills or sweats. Positive sick contact at work. No cough or chest congestion.  He hasn't been exercising as much lately but he normally does. He usually walks and has been doing some indoor gym exercise. He admits he could probably be eating better than he is currently. He has eczema in the wintertime it has been flaring. He wants to know if he can do. He would also like a refill on clobetasol cream which she was given previously by his dermatologist.  He went for blood worklast week.  Review of Systems     Objective:   Physical Exam  Constitutional: He is oriented to person, place, and time. He appears well-developed and well-nourished.  HENT:  Head: Normocephalic and atraumatic.  Right Ear: External ear normal.  Left Ear: External ear normal.  Nose: Nose normal.  Mouth/Throat: Oropharynx is clear and moist.  Eyes: Conjunctivae and EOM are normal. Pupils are equal, round, and reactive to light.  Neck: Normal range of motion. Neck supple. No thyromegaly present.  Cardiovascular: Normal rate, regular rhythm, normal heart sounds and intact distal pulses.   Pulmonary/Chest: Effort normal and breath sounds normal.  Abdominal: Soft. Bowel sounds are normal. He exhibits no distension and no mass. There is no tenderness. There is no rebound and no guarding.  Musculoskeletal: Normal range of motion.  Lymphadenopathy:    He has no cervical adenopathy.  Neurological: He is alert and oriented to person, place, and time. He has normal reflexes.  Skin: Skin is warm and dry.  He has some dry erythematous 2-3 cm scattered patches on his lower legs with some dry scale on the surface.  Psychiatric: He has a normal mood and affect. His behavior is normal. Judgment  and thought content normal.          Assessment & Plan:  CPE- Keep up a regular exercise program and make sure you are eating a healthy diet Try to eat 4 servings of dairy a day, or if you are lactose intolerant take a calcium with vitamin D daily.  Your vaccines are up to date.  EKG shows rate of 74 bpm, normal sinus rhythm with normal axis. No acute ST-T wave changes. Absent R wave in lead 3. This is consistent with EKG done in 2011. Possible old inferior infarct but unclear.  Will order HIV and Hep C at next OV.    Eczema-discussed treatment with moisturizing and using clobetasol as needed.  URI - likely viral. Call if not better by the end of the week or early next week or sooner if gets worse.    Hyperlipidemia-tolerate medications well. Refills sent.

## 2015-06-23 ENCOUNTER — Other Ambulatory Visit: Payer: Self-pay | Admitting: Family Medicine

## 2015-06-30 NOTE — Addendum Note (Signed)
Addended by: Collie Siad on: 06/30/2015 02:20 PM   Modules accepted: Orders

## 2015-09-07 ENCOUNTER — Telehealth: Payer: Self-pay | Admitting: *Deleted

## 2015-09-07 NOTE — Telephone Encounter (Signed)
Pt called and stated that he would like to have his bill recoded. He came in on 06/15/2015 for a CPE and stated that [employer] is not covering this.

## 2016-02-07 ENCOUNTER — Ambulatory Visit (INDEPENDENT_AMBULATORY_CARE_PROVIDER_SITE_OTHER): Payer: BLUE CROSS/BLUE SHIELD | Admitting: Family Medicine

## 2016-02-07 VITALS — BP 128/71 | HR 70 | Temp 98.4°F

## 2016-02-07 DIAGNOSIS — Z23 Encounter for immunization: Secondary | ICD-10-CM | POA: Diagnosis not present

## 2016-02-07 NOTE — Progress Notes (Signed)
Patient ID: Fransisca KaufmannDaryl Mounce, male   DOB: 1960-11-11, [age] y.o.   MRN: [id number] Agree with below.  Nani Gasseratherine Jamin Panther, MD

## 2016-02-07 NOTE — Progress Notes (Signed)
Cloys presents to clinic for his yearly flu vaccination. Denies allergies to eggs, latex, and past reaction to flu vaccine. Pt tolerated injection in the right deltoid well without any complications. Pt advised to f/u as needed.

## 2016-06-12 ENCOUNTER — Telehealth: Payer: Self-pay | Admitting: Family Medicine

## 2016-06-12 DIAGNOSIS — Z0000 Encounter for general adult medical examination without abnormal findings: Secondary | ICD-10-CM

## 2016-06-12 NOTE — Telephone Encounter (Signed)
Pt called requesting that labs be ordered so that he can complete them before his appointment next week. Let me know when they are ordered, and I will call him back to let him know. Thanks!

## 2016-06-12 NOTE — Telephone Encounter (Signed)
Pt informed that labs have been ordered and faxed.Andyn Sales Lynetta  

## 2016-06-14 LAB — COMPLETE METABOLIC PANEL WITH GFR
ALT: 29 U/L (ref 9–46)
AST: 25 U/L (ref 10–35)
Albumin: 4.4 g/dL (ref 3.6–5.1)
Alkaline Phosphatase: 48 U/L (ref 40–115)
BUN: 13 mg/dL (ref 7–25)
CO2: 20 mmol/L (ref 20–31)
Calcium: 9.7 mg/dL (ref 8.6–10.3)
Chloride: 106 mmol/L (ref 98–110)
Creat: 0.97 mg/dL (ref 0.70–1.33)
GFR, Est African American: >89 mL/min (ref >=60)
GFR, Est Non African American: 87 mL/min (ref >=60)
Glucose, Bld: 101 mg/dL — ABNORMAL HIGH (ref 65–99)
Potassium: 4.3 mmol/L (ref 3.5–5.3)
Sodium: 141 mmol/L (ref 135–146)
Total Bilirubin: 0.8 mg/dL (ref 0.2–1.2)
Total Protein: 6.6 g/dL (ref 6.1–8.1)

## 2016-06-14 LAB — LIPID PANEL
Cholesterol: 147 mg/dL (ref <200)
HDL: 33 mg/dL — ABNORMAL LOW (ref >40)
LDL Cholesterol: 97 mg/dL (ref <100)
Total CHOL/HDL Ratio: 4.5 Ratio (ref <5.0)
Triglycerides: 85 mg/dL (ref <150)
VLDL: 17 mg/dL (ref <30)

## 2016-06-15 LAB — PSA: PSA: 1 ng/mL (ref <=4.0)

## 2016-06-19 ENCOUNTER — Encounter: Payer: Self-pay | Admitting: Family Medicine

## 2016-06-19 ENCOUNTER — Ambulatory Visit (INDEPENDENT_AMBULATORY_CARE_PROVIDER_SITE_OTHER): Payer: BLUE CROSS/BLUE SHIELD | Admitting: Family Medicine

## 2016-06-19 VITALS — BP 122/69 | HR 66 | Ht 71.0 in | Wt 229.0 lb

## 2016-06-19 DIAGNOSIS — Z0000 Encounter for general adult medical examination without abnormal findings: Secondary | ICD-10-CM | POA: Diagnosis not present

## 2016-06-19 DIAGNOSIS — R7309 Other abnormal glucose: Secondary | ICD-10-CM | POA: Diagnosis not present

## 2016-06-19 LAB — POCT GLYCOSYLATED HEMOGLOBIN (HGB A1C): Hemoglobin A1C: 5.6

## 2016-06-19 MED ORDER — FENOFIBRATE MICRONIZED 200 MG PO CAPS: 200.0000 mg | ORAL_CAPSULE | Freq: Every day | ORAL | 3 refills | Status: DC

## 2016-06-19 MED ORDER — ATORVASTATIN CALCIUM 40 MG PO TABS: 40.0000 mg | ORAL_TABLET | Freq: Every day | ORAL | 3 refills | Status: DC

## 2016-06-19 NOTE — Progress Notes (Signed)
Patient ID: Cody KaufmannDaryl Huber, male   DOB: 1961/03/18, 56 y.o.   MRN: [id number]  Subjective:    CC: CPE  HPI:  56 year old male with a history of allergic rhinitis and hyperlipidemia comes in for complete physical exam today. He is currently on Lipitor and fenofibrate and fish oil and Metamucil.He is exercising some. Most days during the week he walks a mile a day and on the weekends he's been trying to walk 2-3 miles.   BP 122/69   Pulse 66   Ht 5\' 11"  (1.803 m)   Wt 229 lb (103.9 kg)   SpO2 98%   BMI 31.94 kg/m     No Known Allergies  Past Medical History:  Diagnosis Date  . Eczema   . Elevated cholesterol   . Hyperlipidemia   . Nasal congestion   . Right inguinal hernia     Past Surgical History:  Procedure Laterality Date  . HEMORRHOID SURGERY  2006 - approximate  . INGUINAL HERNIA REPAIR  07/24/2011   Procedure: LAPAROSCOPIC BILATERAL INGUINAL HERNIA REPAIR;  Surgeon: Ardeth SportsmanSteven C. Gross, MD;  Location: [facility];  Service: General;  Laterality: Bilateral;    Social History   Social History  . Marital status: Married    Spouse name: Misty StanleyLisa  . Number of children: 0  . Years of education: BS   Occupational History  . Asst Manager     [employer]   Social History Main Topics  . Smoking status: Former Smoker    Quit date: 05/21/1984  . Smokeless tobacco: Never Used  . Alcohol use 1.0 oz/week    2 drink(s) per week     Comment: 5-6 beers per week  . Drug use: No  . Sexual activity: Yes    Partners: Female   Other Topics Concern  . Not on file   Social History Narrative   Regular exercise. Walking 6 days per week. Walks aobut a mile per day.     Family History  Problem Relation Age of Onset  . Diabetes Father   . Melanoma Father   . Hyperlipidemia Father   . Hypertension Mother   . Heart disease Father 3970  . Aneurysm Mother 5870    Outpatient Encounter Prescriptions as of 06/19/2016  Medication Sig  . atorvastatin (LIPITOR) 40 MG tablet Take 1 tablet (40 mg  total) by mouth daily.  . clobetasol cream (TEMOVATE) 0.05 % Apply 1 application topically daily as needed.  . fenofibrate micronized (LOFIBRA) 200 MG capsule Take 1 capsule (200 mg total) by mouth at bedtime.  . fish oil-omega-3 fatty acids 1000 MG capsule Take 3 g by mouth at bedtime.   . Psyllium (METAMUCIL PO) Take by mouth at bedtime. Patient takes 1 tablespoonful per day  . [DISCONTINUED] atorvastatin (LIPITOR) 40 MG tablet take 1 tablet by mouth once daily  . [DISCONTINUED] fenofibrate micronized (LOFIBRA) 200 MG capsule Take 1 capsule (200 mg total) by mouth at bedtime.   No facility-administered encounter medications on file as of 06/19/2016.       Review of Systems: No fevers, chills, night sweats, weight loss, chest pain, or shortness of breath.   Objective:    General: Well Developed, well nourished, and in no acute distress.  Neuro: Alert and oriented x3, extra-ocular muscles intact, sensation grossly intact.  HEENT: Normocephalic, atraumatic  Skin: Warm and dry, no rashes. Cardiac: Regular rate and rhythm, no murmurs rubs or gallops, no lower extremity edema.  Respiratory: Clear to auscultation bilaterally. Not using accessory muscles,  speaking in full sentences.   Impression and Recommendations:   Keep up a regular exercise program and make sure you are eating a healthy diet Try to eat 4 servings of dairy a day, or if you are lactose intolerant take a calcium with vitamin D daily.  Your vaccines are up to date.   Abnormal glucose-hemoglobin A1c of 5.6 today. Encouraged healthy diet decreasing concentrated sugars and carbs. Recommend recheck in one year.

## 2016-06-19 NOTE — Patient Instructions (Signed)
Keep up a regular exercise program and make sure you are eating a healthy diet Try to eat 4 servings of dairy a day, or if you are lactose intolerant take a calcium with vitamin D daily.  Your vaccines are up to date.   

## 2017-02-21 ENCOUNTER — Ambulatory Visit (INDEPENDENT_AMBULATORY_CARE_PROVIDER_SITE_OTHER): Payer: BLUE CROSS/BLUE SHIELD | Admitting: Family Medicine

## 2017-02-21 DIAGNOSIS — Z23 Encounter for immunization: Secondary | ICD-10-CM | POA: Diagnosis not present

## 2017-02-26 LAB — HEMOGLOBIN A1C: Hemoglobin A1C: 5.4

## 2017-02-26 LAB — LIPID PANEL
Cholesterol: 138 (ref 0–200)
HDL: 27 — AB (ref 35–70)
LDL Cholesterol: 82
Triglycerides: 149 (ref 40–160)

## 2017-06-19 ENCOUNTER — Encounter (INDEPENDENT_AMBULATORY_CARE_PROVIDER_SITE_OTHER): Payer: Self-pay

## 2017-06-19 ENCOUNTER — Encounter: Payer: Self-pay | Admitting: Family Medicine

## 2017-06-19 ENCOUNTER — Ambulatory Visit (INDEPENDENT_AMBULATORY_CARE_PROVIDER_SITE_OTHER): Payer: BLUE CROSS/BLUE SHIELD | Admitting: Family Medicine

## 2017-06-19 VITALS — BP 128/88 | HR 70 | Ht 70.98 in | Wt 230.0 lb

## 2017-06-19 DIAGNOSIS — R7309 Other abnormal glucose: Secondary | ICD-10-CM | POA: Diagnosis not present

## 2017-06-19 DIAGNOSIS — Z0000 Encounter for general adult medical examination without abnormal findings: Secondary | ICD-10-CM | POA: Diagnosis not present

## 2017-06-19 DIAGNOSIS — Z114 Encounter for screening for human immunodeficiency virus [HIV]: Secondary | ICD-10-CM

## 2017-06-19 DIAGNOSIS — M25551 Pain in right hip: Secondary | ICD-10-CM | POA: Diagnosis not present

## 2017-06-19 DIAGNOSIS — Z1159 Encounter for screening for other viral diseases: Secondary | ICD-10-CM | POA: Diagnosis not present

## 2017-06-19 DIAGNOSIS — E785 Hyperlipidemia, unspecified: Secondary | ICD-10-CM | POA: Diagnosis not present

## 2017-06-19 NOTE — Patient Instructions (Addendum)

## 2017-06-19 NOTE — Progress Notes (Signed)
Subjective:    Patient ID: Cody Huber, male    DOB: 1960-08-13, 57 y.o.   MRN: [id number]  HPI   57 year old male is here today for complete physical exam.   He did want to discuss his right hip.  He is been having pain right over the groin crease that sometimes radiates down into the outer thigh and sometimes all the way down to the knee.  He said last night it was actually more in the medial groin area.  He does not remember any specific injury or trauma but it has been bothering him at least for a couple of months.  Sometimes it is bad enough that he will take an anti-inflammatory, use a heating pad.  He does feel like it has gotten progressively worse over the last 1-1/2 weeks.  He says the pain is worse going downhill but does okay walking uphill.  He says at times the pain is severe enough that he actually will limp.  It bothers him daily.  He did get his Shingrix vaccination series performed at [facility]  will call for vaccine report.     Review of Systems   Comprehensive review of systems is negative except for HPI.  BP 128/88 (BP Location: Left Arm)   Pulse 70   Ht 5' 10.98" (1.803 m)   Wt 230 lb (104.3 kg)   BMI 32.09 kg/m     No Known Allergies  Past Medical History:  Diagnosis Date  . Eczema   . Elevated cholesterol   . Hyperlipidemia   . Nasal congestion   . Right inguinal hernia     Past Surgical History:  Procedure Laterality Date  . HEMORRHOID SURGERY  2006 - approximate  . INGUINAL HERNIA REPAIR  07/24/2011   Procedure: LAPAROSCOPIC BILATERAL INGUINAL HERNIA REPAIR;  Surgeon: Ardeth Sportsman, MD;  Location: [facility];  Service: General;  Laterality: Bilateral;    Social History   Socioeconomic History  . Marital status: Married    Spouse name: Misty Stanley  . Number of children: 0  . Years of education: BS  . Highest education level: Not on file  Social Needs  . Financial resource strain: Not on file  . Food insecurity - worry: Not on file  . Food  insecurity - inability: Not on file  . Transportation needs - medical: Not on file  . Transportation needs - non-medical: Not on file  Occupational History  . Occupation: Asst Manager    Comment: [employer]  Tobacco Use  . Smoking status: Former Smoker    Last attempt to quit: 05/21/1984    Years since quitting: 33.1  . Smokeless tobacco: Never Used  Substance and Sexual Activity  . Alcohol use: Yes    Alcohol/week: 1.0 oz    Types: 2 drink(s) per week    Comment: 5-6 beers per week  . Drug use: No  . Sexual activity: Yes    Partners: Female  Other Topics Concern  . Not on file  Social History Narrative   Regular exercise. Walking 6 days per week. Walks aobut a mile per day.     Family History  Problem Relation Age of Onset  . Diabetes Father   . Melanoma Father   . Hyperlipidemia Father   . Hypertension Mother   . Heart disease Father 63  . Aneurysm Mother 67    Outpatient Encounter Medications as of 06/19/2017  Medication Sig  . atorvastatin (LIPITOR) 40 MG tablet Take 1 tablet (40 mg total)  by mouth daily.  . clobetasol cream (TEMOVATE) 0.05 % Apply 1 application topically daily as needed.  . fenofibrate micronized (LOFIBRA) 200 MG capsule Take 1 capsule (200 mg total) by mouth at bedtime.  . fish oil-omega-3 fatty acids 1000 MG capsule Take 3 g by mouth at bedtime.   . Psyllium (METAMUCIL PO) Take by mouth at bedtime. Patient takes 1 tablespoonful per day   No facility-administered encounter medications on file as of 06/19/2017.          Objective:   Physical Exam  Constitutional: He is oriented to person, place, and time. He appears well-developed and well-nourished.  HENT:  Head: Normocephalic and atraumatic.  Right Ear: External ear normal.  Left Ear: External ear normal.  Nose: Nose normal.  Mouth/Throat: Oropharynx is clear and moist.  Eyes: Conjunctivae and EOM are normal. Pupils are equal, round, and reactive to light.  Neck: Normal range of motion.  Neck supple. No thyromegaly present.  Cardiovascular: Normal rate, regular rhythm, normal heart sounds and intact distal pulses.  Pulmonary/Chest: Effort normal and breath sounds normal.  Abdominal: Soft. Bowel sounds are normal. He exhibits no distension and no mass. There is no tenderness. There is no rebound and no guarding.  Musculoskeletal: Normal range of motion.  Lymphadenopathy:    He has no cervical adenopathy.  Neurological: He is alert and oriented to person, place, and time. He has normal reflexes.  Skin: Skin is warm and dry.  Psychiatric: He has a normal mood and affect. His behavior is normal. Judgment and thought content normal.        Assessment & Plan:  CPE -  Keep up a regular exercise program and make sure you are eating a healthy diet Try to eat 4 servings of dairy a day, or if you are lactose intolerant take a calcium with vitamin D daily.  Your vaccines are up to date.  Labs are up to date from October.    Right hip pain -he really did not have a lot of discomfort with external or internal rotation of the hip but the pain he is describing sounds most consistent with hip pain.  Is been using anti-inflammatories occasionally.  As well as a heating pad.  Will call with results once available.  Will likely try to get him in with 1 of our sports medicine providers.  Am concerned that his pain is severe enough that he does limp at times.

## 2017-06-25 ENCOUNTER — Other Ambulatory Visit: Payer: Self-pay | Admitting: Family Medicine

## 2017-06-28 ENCOUNTER — Ambulatory Visit (INDEPENDENT_AMBULATORY_CARE_PROVIDER_SITE_OTHER): Payer: BLUE CROSS/BLUE SHIELD

## 2017-06-28 ENCOUNTER — Other Ambulatory Visit: Payer: Self-pay | Admitting: *Deleted

## 2017-06-28 DIAGNOSIS — M1611 Unilateral primary osteoarthritis, right hip: Secondary | ICD-10-CM

## 2017-06-28 DIAGNOSIS — M25551 Pain in right hip: Secondary | ICD-10-CM | POA: Diagnosis not present

## 2017-07-05 ENCOUNTER — Other Ambulatory Visit: Payer: Self-pay | Admitting: Family Medicine

## 2017-07-09 ENCOUNTER — Encounter (INDEPENDENT_AMBULATORY_CARE_PROVIDER_SITE_OTHER): Payer: Self-pay | Admitting: Orthopaedic Surgery

## 2017-07-09 ENCOUNTER — Ambulatory Visit (INDEPENDENT_AMBULATORY_CARE_PROVIDER_SITE_OTHER): Payer: BLUE CROSS/BLUE SHIELD | Admitting: Orthopaedic Surgery

## 2017-07-09 VITALS — BP 131/79 | HR 69 | Resp 16 | Ht 71.0 in | Wt 230.0 lb

## 2017-07-09 DIAGNOSIS — M25551 Pain in right hip: Secondary | ICD-10-CM

## 2017-07-09 NOTE — Progress Notes (Signed)
Office Visit Note   Patient: Cody KaufmannDaryl Huber           Date of Birth: 27-Oct-1960           MRN: [id number] Visit Date: 07/09/2017              Requested by: Agapito GamesMetheney, Catherine D, MD [address] [place], [place] [id number] PCP: Agapito GamesMetheney, Catherine D, MD   Assessment & Plan: Visit Diagnoses:  1. Pain of right hip joint     Plan: Osteoarthritis right hip area long discussion regarding diagnosis and review of x-rays. Have discussed NSAIDs, cortisone injection and total hip replacement. We've also discussed exercises. Mr. Cody Huber would like to continue with the ibuprofen and call if he needs a cortisone injection. At this point he like to hold off on any consideration of a hip replacement  Follow-Up Instructions: Return if symptoms worsen or fail to improve.   Orders:  No orders of the defined types were placed in this encounter.  No orders of the defined types were placed in this encounter.     Procedures: No procedures performed   Clinical Data: No additional findings.   Subjective: Chief Complaint  Patient presents with  . Right Hip - Pain     He is been having pain right over the groin crease that sometimes radiates down into the outer thigh and sometimes all the way down to the knee.  He said last night it was actually more in the medial groin area  Mr. Cody Huber is 57 years old and has been experiencing pain in his right and to a lesser extent left groin for a "long time". He was recently evaluated by his primary care physician with evidence of osteoarthritis. He denies any history of injury or trauma. He does work on his feet. He felt like a month in January he had significant exacerbation for no apparent reason. He's not had any fever or chills. Denies any back pain. Not had any numbness or tingling. Ibuprofen is been very effective. He has had to compromise some of his activities based on his pain. He is able to sleep. I reviewed the films of his pelvis  on the PACS system. He has said significant osteophytes along the right femoral head and the head is misshapen and more oblong. There are areas of significant decrease in the joint space tickly superiorly where there are areas of sclerosis on both sides of the joint. Certainly consistent with end-stage osteoarthritis  HPI  Review of Systems   Objective: Vital Signs: BP 131/79   Pulse 69   Resp 16   Ht 5\' 11"  (1.803 m)   Wt 230 lb (104.3 kg)   BMI 32.08 kg/m   Physical Exam  Ortho Exam awake alert and oriented 3. Comfortable sitting walks with a slight duck waddling gait referable to his right hip. Does have some decreased internal rotation of his right compared to left hip. External rotation was uncomfortable extreme of external rotation on the right and not the left. No pain with flexion-extension. No distal edema. Skin intact. Neurovascular exam intact. Straight leg raise negative bilaterally. No percussible tenderness lumbar spine. No pain over the greater trochanter.  Specialty Comments:  No specialty comments available.  Imaging: No results found.   PMFS History: Patient Active Problem List   Diagnosis Date Noted  . Abnormal glucose 06/19/2016  . Family history of aneurysm 06/09/2014  . Preventive measure 02/25/2014  . AR (allergic rhinitis) 05/20/2012  .  Obturator hernia, right 08/13/2011  . Bilateral inguinal hernia (R>>L) 07/10/2011  . Diastasis recti 07/10/2011  . Hyperlipemia 10/21/2008   Past Medical History:  Diagnosis Date  . Eczema   . Elevated cholesterol   . Hyperlipidemia   . Nasal congestion   . Right inguinal hernia     Family History  Problem Relation Age of Onset  . Diabetes Father   . Melanoma Father   . Hyperlipidemia Father   . Hypertension Mother   . Heart disease Father 93  . Aneurysm Mother 62    Past Surgical History:  Procedure Laterality Date  . HEMORRHOID SURGERY  2006 - approximate  . INGUINAL HERNIA REPAIR  07/24/2011    Procedure: LAPAROSCOPIC BILATERAL INGUINAL HERNIA REPAIR;  Surgeon: Ardeth Sportsman, MD;  Location: [facility];  Service: General;  Laterality: Bilateral;   Social History   Occupational History  . Occupation: Asst Manager    Comment: [employer]  Tobacco Use  . Smoking status: Former Smoker    Last attempt to quit: 05/21/1984    Years since quitting: 33.1  . Smokeless tobacco: Never Used  Substance and Sexual Activity  . Alcohol use: Yes    Alcohol/week: 1.0 oz    Types: 2 drink(s) per week    Comment: 5-6 beers per week  . Drug use: No  . Sexual activity: Yes    Partners: Female

## 2018-02-06 ENCOUNTER — Ambulatory Visit: Payer: BLUE CROSS/BLUE SHIELD

## 2018-02-12 ENCOUNTER — Ambulatory Visit (INDEPENDENT_AMBULATORY_CARE_PROVIDER_SITE_OTHER): Payer: BLUE CROSS/BLUE SHIELD | Admitting: Family Medicine

## 2018-02-12 DIAGNOSIS — Z23 Encounter for immunization: Secondary | ICD-10-CM | POA: Diagnosis not present

## 2018-06-17 ENCOUNTER — Telehealth: Payer: Self-pay | Admitting: Family Medicine

## 2018-06-17 DIAGNOSIS — Z114 Encounter for screening for human immunodeficiency virus [HIV]: Secondary | ICD-10-CM

## 2018-06-17 DIAGNOSIS — E785 Hyperlipidemia, unspecified: Secondary | ICD-10-CM

## 2018-06-17 DIAGNOSIS — Z1159 Encounter for screening for other viral diseases: Secondary | ICD-10-CM

## 2018-06-17 DIAGNOSIS — Z0000 Encounter for general adult medical examination without abnormal findings: Secondary | ICD-10-CM

## 2018-06-17 NOTE — Telephone Encounter (Signed)
Pt advised.

## 2018-06-17 NOTE — Telephone Encounter (Signed)
Patient would like labs ordered prior to physical on 07/01/2018. Please contact and advise once ready for pick up.

## 2018-06-17 NOTE — Telephone Encounter (Signed)
Order printed

## 2018-06-17 NOTE — Telephone Encounter (Signed)
Labs pended for PCP review including HM screening for HIV/Hep C.

## 2018-06-20 LAB — COMPLETE METABOLIC PANEL WITH GFR
AG Ratio: 2.2 (calc) (ref 1.0–2.5)
ALT: 28 U/L (ref 9–46)
AST: 22 U/L (ref 10–35)
Albumin: 4.6 g/dL (ref 3.6–5.1)
Alkaline phosphatase (APISO): 51 U/L (ref 40–115)
BUN: 17 mg/dL (ref 7–25)
CO2: 27 mmol/L (ref 20–32)
Calcium: 9.7 mg/dL (ref 8.6–10.3)
Chloride: 108 mmol/L (ref 98–110)
Creat: 1.03 mg/dL (ref 0.70–1.33)
GFR, Est African American: 92 mL/min/{1.73_m2} (ref >=60)
GFR, Est Non African American: 80 mL/min/{1.73_m2} (ref >=60)
Globulin: 2.1 g/dL (calc) (ref 1.9–3.7)
Glucose, Bld: 89 mg/dL (ref 65–99)
Potassium: 4.4 mmol/L (ref 3.5–5.3)
Sodium: 142 mmol/L (ref 135–146)
Total Bilirubin: 0.8 mg/dL (ref 0.2–1.2)
Total Protein: 6.7 g/dL (ref 6.1–8.1)

## 2018-06-20 LAB — CBC
HCT: 46.8 % (ref 38.5–50.0)
Hemoglobin: 15.4 g/dL (ref 13.2–17.1)
MCH: 28.1 pg (ref 27.0–33.0)
MCHC: 32.9 g/dL (ref 32.0–36.0)
MCV: 85.2 fL (ref 80.0–100.0)
MPV: 10.7 fL (ref 7.5–12.5)
Platelets: 229 10*3/uL (ref 140–400)
RBC: 5.49 10*6/uL (ref 4.20–5.80)
RDW: 13.1 % (ref 11.0–15.0)
WBC: 5.1 10*3/uL (ref 3.8–10.8)

## 2018-06-20 LAB — HIV ANTIBODY (ROUTINE TESTING W REFLEX): HIV 1&2 Ab, 4th Generation: NONREACTIVE

## 2018-06-20 LAB — PSA: PSA: 0.6 ng/mL (ref <=4.0)

## 2018-06-20 LAB — LIPID PANEL
Cholesterol: 137 mg/dL (ref <200)
HDL: 34 mg/dL — ABNORMAL LOW (ref >40)
LDL Cholesterol (Calc): 83 mg/dL (calc)
Non-HDL Cholesterol (Calc): 103 mg/dL (calc) (ref <130)
Total CHOL/HDL Ratio: 4 (calc) (ref <5.0)
Triglycerides: 108 mg/dL (ref <150)

## 2018-06-20 LAB — HEPATITIS C ANTIBODY
Hepatitis C Ab: NONREACTIVE
SIGNAL TO CUT-OFF: 0.01 (ref <1.00)

## 2018-06-20 NOTE — Telephone Encounter (Signed)
All labs are normal. 

## 2018-06-21 ENCOUNTER — Other Ambulatory Visit: Payer: Self-pay | Admitting: Family Medicine

## 2018-07-01 ENCOUNTER — Ambulatory Visit (INDEPENDENT_AMBULATORY_CARE_PROVIDER_SITE_OTHER): Payer: BLUE CROSS/BLUE SHIELD | Admitting: Family Medicine

## 2018-07-01 ENCOUNTER — Encounter: Payer: Self-pay | Admitting: Family Medicine

## 2018-07-01 ENCOUNTER — Telehealth: Payer: Self-pay | Admitting: Family Medicine

## 2018-07-01 VITALS — BP 123/72 | HR 70 | Ht 71.0 in | Wt 230.0 lb

## 2018-07-01 DIAGNOSIS — Z0000 Encounter for general adult medical examination without abnormal findings: Secondary | ICD-10-CM | POA: Diagnosis not present

## 2018-07-01 DIAGNOSIS — M25551 Pain in right hip: Secondary | ICD-10-CM | POA: Diagnosis not present

## 2018-07-01 DIAGNOSIS — L821 Other seborrheic keratosis: Secondary | ICD-10-CM | POA: Diagnosis not present

## 2018-07-01 NOTE — Patient Instructions (Signed)

## 2018-07-01 NOTE — Telephone Encounter (Signed)
Pt advised.

## 2018-07-01 NOTE — Progress Notes (Signed)
Subjective:    Patient ID: Cody Huber, male    DOB: 01-18-1961, 58 y.o.   MRN: [id number]  HPI 58 year old male comes in today for complete physical exam.  He has already gone for labwork about 2 weeks ago.  About panel, lipid panel, PSA were normal.  He was negative for hep C and negative for HIV.  He is doing well overall.  His only concern is that he will likely need hip replacement this year.  He is thinking about scheduling it in the fall.  He is also starting to get a little bit of low back pain on that left side just above the hip as well is been bothering him a little bit more for about 3 weeks but it comes and goes.  He has been using ibuprofen 4 mg each morning for the hip pain and says that usually helps him get through the day but does worry some about taking chronic NSAIDs.  He has has a skin lesion on his left inner thigh that he would like me to look at today.  He says not painful or bothersome but just wants to make sure that it looks okay.  Is due for 5-year recall on his colonoscopy in March and says that the GI office has already contacted him to schedule he just has not scheduled it yet.  He said he will likely wait until May or June as he is trying to clean out his mother's house who recently passed away.   Review of Systems     BP 123/72   Pulse 70   Ht 5\' 11"  (1.803 m)   Wt 230 lb (104.3 kg)   SpO2 98%   BMI 32.08 kg/m     No Known Allergies  Past Medical History:  Diagnosis Date  . Eczema   . Elevated cholesterol   . Hyperlipidemia   . Nasal congestion   . Right inguinal hernia     Past Surgical History:  Procedure Laterality Date  . HEMORRHOID SURGERY  2006 - approximate  . INGUINAL HERNIA REPAIR  07/24/2011   Procedure: LAPAROSCOPIC BILATERAL INGUINAL HERNIA REPAIR;  Surgeon: Ardeth Sportsman, MD;  Location: [facility];  Service: General;  Laterality: Bilateral;    Social History   Socioeconomic History  . Marital status: Married    Spouse name:  Misty Stanley  . Number of children: 0  . Years of education: BS  . Highest education level: Not on file  Occupational History  . Occupation: Asst Production designer, theatre/television/film    Comment: [employer]  Social Needs  . Financial resource strain: Not on file  . Food insecurity:    Worry: Not on file    Inability: Not on file  . Transportation needs:    Medical: Not on file    Non-medical: Not on file  Tobacco Use  . Smoking status: Former Smoker    Last attempt to quit: 05/21/1984    Years since quitting: 34.1  . Smokeless tobacco: Never Used  Substance and Sexual Activity  . Alcohol use: Yes    Alcohol/week: 2.0 standard drinks    Types: 2 drink(s) per week    Comment: 5-6 beers per week  . Drug use: No  . Sexual activity: Yes    Partners: Female  Lifestyle  . Physical activity:    Days per week: Not on file    Minutes per session: Not on file  . Stress: Not on file  Relationships  . Social connections:  Talks on phone: Not on file    Gets together: Not on file    Attends religious service: Not on file    Active member of club or organization: Not on file    Attends meetings of clubs or organizations: Not on file    Relationship status: Not on file  . Intimate partner violence:    Fear of current or ex partner: Not on file    Emotionally abused: Not on file    Physically abused: Not on file    Forced sexual activity: Not on file  Other Topics Concern  . Not on file  Social History Narrative   Regular exercise. Walking 6 days per week. Walks aobut a mile per day.     Family History  Problem Relation Age of Onset  . Diabetes Father   . Melanoma Father   . Hyperlipidemia Father   . Hypertension Mother   . Heart disease Father 82  . Aneurysm Mother 24    Outpatient Encounter Medications as of 07/01/2018  Medication Sig  . atorvastatin (LIPITOR) 40 MG tablet TAKE 1 TABLET BY MOUTH DAILY.  . clobetasol cream (TEMOVATE) 0.05 % Apply 1 application topically daily as needed.  . fenofibrate  micronized (LOFIBRA) 200 MG capsule TAKE 1 CAPSULE BY MOUTH AT BEDTIME  . fish oil-omega-3 fatty acids 1000 MG capsule Take 3 g by mouth at bedtime.   . Psyllium (METAMUCIL PO) Take by mouth at bedtime. Patient takes 1 tablespoonful per day   No facility-administered encounter medications on file as of 07/01/2018.       Objective:   Physical Exam Constitutional:      Appearance: He is well-developed.  HENT:     Head: Normocephalic and atraumatic.     Right Ear: External ear normal.     Left Ear: External ear normal.     Nose: Nose normal.  Eyes:     Conjunctiva/sclera: Conjunctivae normal.     Pupils: Pupils are equal, round, and reactive to light.  Neck:     Musculoskeletal: Normal range of motion and neck supple.     Thyroid: No thyromegaly.  Cardiovascular:     Rate and Rhythm: Normal rate and regular rhythm.     Heart sounds: Normal heart sounds.  Pulmonary:     Effort: Pulmonary effort is normal.     Breath sounds: Normal breath sounds.  Abdominal:     General: Bowel sounds are normal. There is no distension.     Palpations: Abdomen is soft. There is no mass.     Tenderness: There is no abdominal tenderness. There is no guarding or rebound.  Musculoskeletal: Normal range of motion.  Lymphadenopathy:     Cervical: No cervical adenopathy.  Skin:    General: Skin is warm and dry.     Comments: The left inner thigh there is an oval-shaped slightly papular rough textured dark pink-colored lesion most consistent with a seborrheic keratosis.  Neurological:     Mental Status: He is alert and oriented to person, place, and time.     Deep Tendon Reflexes: Reflexes are normal and symmetric.  Psychiatric:        Behavior: Behavior normal.        Thought Content: Thought content normal.        Judgment: Judgment normal.        Assessment & Plan:  CPE Keep up a regular exercise program and make sure you are eating a healthy diet Try to eat 4  servings of dairy a day, or if  you are lactose intolerant take a calcium with vitamin D daily.  Your vaccines are up to date.  Reviewed labs with him.  Printed copy provided.  Hip osteoarthritis- planning for replacement in the fall.  Using IBU. Consider using Aleve which last longer.  If noticing nay stomach upset or irritation will need to start a PPI or H2 blocker.    Skin lesion on left inner hip is most consistent with a seborrheic keratosis.  Explained benign nature of these lesions but did keep an eye on it and if it changes in shape or color etc. do please let me know and I will be happy to evaluate it again.

## 2018-07-01 NOTE — Telephone Encounter (Signed)
Please call patient and let him know that I apologize I forgot to write down on the name of a couple of the orthopedist in [place] who do hip replacements. Cody Huber is really good.

## 2018-09-18 ENCOUNTER — Other Ambulatory Visit: Payer: Self-pay | Admitting: Family Medicine

## 2018-09-18 DIAGNOSIS — E785 Hyperlipidemia, unspecified: Secondary | ICD-10-CM

## 2018-09-23 ENCOUNTER — Telehealth: Payer: Self-pay | Admitting: Orthopaedic Surgery

## 2018-09-23 ENCOUNTER — Other Ambulatory Visit: Payer: Self-pay | Admitting: Orthopaedic Surgery

## 2018-09-23 DIAGNOSIS — M25551 Pain in right hip: Secondary | ICD-10-CM

## 2018-09-23 NOTE — Telephone Encounter (Signed)
Please advise 

## 2018-09-23 NOTE — Telephone Encounter (Signed)
Refer to Dr Alvester Morin for a right hi;p injection

## 2018-09-23 NOTE — Telephone Encounter (Signed)
Patient [facility] stating he is having so much pain with his hip, he wanted to know if he could get an injection to help with pain until surgery can be scheduled. Please call to advise.

## 2018-09-23 NOTE — Telephone Encounter (Signed)
Spoke with patient. He is aware that order has been placed for injection.

## 2018-10-17 ENCOUNTER — Ambulatory Visit: Payer: BLUE CROSS/BLUE SHIELD | Admitting: Physical Medicine and Rehabilitation

## 2018-10-21 ENCOUNTER — Other Ambulatory Visit: Payer: Self-pay

## 2018-10-21 ENCOUNTER — Ambulatory Visit (INDEPENDENT_AMBULATORY_CARE_PROVIDER_SITE_OTHER): Payer: BC Managed Care – PPO | Admitting: Physical Medicine and Rehabilitation

## 2018-10-21 ENCOUNTER — Encounter: Payer: Self-pay | Admitting: Physical Medicine and Rehabilitation

## 2018-10-21 ENCOUNTER — Ambulatory Visit: Payer: Self-pay

## 2018-10-21 DIAGNOSIS — M25551 Pain in right hip: Secondary | ICD-10-CM | POA: Diagnosis not present

## 2018-10-21 NOTE — Progress Notes (Signed)
   Cody Huber - 58 y.o. male MRN [id number]  Date of birth: 1960/07/09  Office Visit Note: Visit Date: 10/21/2018 PCP: Agapito Games, MD Referred by: Agapito Games, *  Subjective: Chief Complaint  Patient presents with  . Right Hip - Pain   HPI:  Cody Huber is a 58 y.o. male who comes in today For diagnostic and hopefully therapeutic anesthetic hip arthrogram on the right at the request of Dr. Norlene Huber.  Patient having right hip pain but not much in the way of groin pain for the last year.  Failing conservative care also has a spine history.  ROS Otherwise per HPI.  Assessment & Plan: Visit Diagnoses:  1. Pain in right hip     Plan: Findings:  Diagnostic of therapeutic anesthetic hip arthrogram in the right showed significant osteoarthritis and patient did have relief during the anesthetic phase of the injection.    Meds & Orders: No orders of the defined types were placed in this encounter.   Orders Placed This Encounter  Procedures  . Large Joint Inj: R hip joint  . XR C-ARM NO REPORT    Follow-up: Return if symptoms worsen or fail to improve, for Cody Campbell, MD.   Procedures: Large Joint Inj: R hip joint on 10/21/2018 1:05 PM Indications: pain and diagnostic evaluation Details: 22 G needle, anterior approach  Arthrogram: Yes  Medications: 80 mg triamcinolone acetonide 40 MG/ML; 4 mL bupivacaine 0.25 % Outcome: tolerated well, no immediate complications  Arthrogram demonstrated excellent flow of contrast throughout the joint surface without extravasation or obvious defect.  The patient had relief of symptoms during the anesthetic phase of the injection.  Procedure, treatment alternatives, risks and benefits explained, specific risks discussed. Consent was given by the patient. Immediately prior to procedure a time out was called to verify the correct patient, procedure, equipment, support staff and site/side marked as required. Patient  was prepped and draped in the usual sterile fashion.      No notes on file   Clinical History: No specialty comments available.     Objective:  VS:  HT:    WT:   BMI:     BP:   HR: bpm  TEMP: ( )  RESP:  Physical Exam  Ortho Exam Imaging: Xr C-arm No Report  Result Date: 10/21/2018 Please see Notes tab for imaging impression.

## 2018-10-21 NOTE — Progress Notes (Signed)
 .  Numeric Pain Rating Scale and Functional Assessment Average Pain 6   In the last MONTH (on 0-10 scale) has pain interfered with the following?  1. General activity like being  able to carry out your everyday physical activities such as walking, climbing stairs, carrying groceries, or moving a chair?  Rating(5)  -Dye Allergies.  

## 2018-10-22 MED ORDER — BUPIVACAINE HCL 0.25 % IJ SOLN
4.0000 mL | INTRAMUSCULAR | Status: AC | PRN
  Administered 2018-10-21: 4 mL via INTRA_ARTICULAR

## 2018-10-22 MED ORDER — TRIAMCINOLONE ACETONIDE 40 MG/ML IJ SUSP
80.0000 mg | INTRAMUSCULAR | Status: AC | PRN
  Administered 2018-10-21: 80 mg via INTRA_ARTICULAR

## 2018-12-31 ENCOUNTER — Other Ambulatory Visit: Payer: Self-pay | Admitting: Family Medicine

## 2018-12-31 DIAGNOSIS — E785 Hyperlipidemia, unspecified: Secondary | ICD-10-CM

## 2019-01-14 ENCOUNTER — Ambulatory Visit (INDEPENDENT_AMBULATORY_CARE_PROVIDER_SITE_OTHER): Payer: BC Managed Care – PPO | Admitting: Family Medicine

## 2019-01-14 DIAGNOSIS — Z23 Encounter for immunization: Secondary | ICD-10-CM

## 2019-02-25 ENCOUNTER — Other Ambulatory Visit: Payer: Self-pay

## 2019-02-25 MED ORDER — CLOBETASOL PROPIONATE 0.05 % EX CREA: 1.0000 "application " | TOPICAL_CREAM | Freq: Every day | CUTANEOUS | 1 refills | Status: DC | PRN

## 2019-05-26 ENCOUNTER — Ambulatory Visit (INDEPENDENT_AMBULATORY_CARE_PROVIDER_SITE_OTHER): Payer: BC Managed Care – PPO | Admitting: Orthopaedic Surgery

## 2019-05-26 ENCOUNTER — Other Ambulatory Visit: Payer: Self-pay

## 2019-05-26 ENCOUNTER — Ambulatory Visit: Payer: Self-pay

## 2019-05-26 VITALS — Ht 71.0 in | Wt 228.0 lb

## 2019-05-26 DIAGNOSIS — M25551 Pain in right hip: Secondary | ICD-10-CM | POA: Diagnosis not present

## 2019-05-26 DIAGNOSIS — M1611 Unilateral primary osteoarthritis, right hip: Secondary | ICD-10-CM

## 2019-05-26 NOTE — Progress Notes (Signed)
Office Visit Note   Patient: Cody Huber           Date of Birth: 1960-10-21           MRN: [id number] Visit Date: 05/26/2019              Requested by: Hali Marry, [address] [place],  [place] [id number] PCP: Hali Marry, MD   Assessment & Plan: Visit Diagnoses:  1. Pain in right hip   2. Unilateral primary osteoarthritis, right hip     Plan: He totally understands that he has severe end-stage arthritis that is worsened over the last year.  At this point he is interested in total hip arthroplasty but wants to wait to see how things go with the COVID-19 pandemic.  I showed him a hip model and went over his x-rays in detail.  I gave him a handout about hip replacement surgery.  I described in detail what the surgery involves including giving him an understanding of the risk and benefits of surgery and discussed what his interoperative and postoperative course would involve.  All question concerns were answered and addressed.  When he decides to have this done he can call.  He can also call to see about another intra-articular steroid injection in the interim if he would like to since it has been over 6 months since his last injection.  Follow-Up Instructions: Return if symptoms worsen or fail to improve.   Orders:  Orders Placed This Encounter  Procedures  . XR HIP UNILAT W OR W/O PELVIS 1V RIGHT   No orders of the defined types were placed in this encounter.     Procedures: No procedures performed   Clinical Data: No additional findings.   Subjective: Chief Complaint  Patient presents with  . Right Hip - Pain  The patient is a very pleasant 59 year old gentleman with severe end-stage arthritis involving his right hip.  This is been well diagnosed for several years now.  In June of last year he did have an intra-articular steroid injection in the right hip and lasted about 4 months.  His pain is become daily and his hip is very  stiff.  At this point his right hip pain is detrimentally affecting his quality of life, his mobility and his actives of daily living.  His pain is daily.  He walks with a significant limp.  He says is difficult to put his shoes and socks on or cross his leg on that side.  He is interested at this point with total hip arthroplasty surgery given the very conservative treatment for over several years now.  This also includes activity modification, rest, anti-inflammatories and hip strengthening exercises.  He is not a diabetic  HPI  Review of Systems He currently denies any headache, chest pain, shortness of breath, fever, chills, nausea, vomiting  Objective: Vital Signs: Ht 5\' 11"  (1.803 m)   Wt 228 lb (103.4 kg)   BMI 31.80 kg/m   Physical Exam He is alert and orient x3 and in no acute distress Ortho Exam Examination of his right hip shows severe stiffness with internal and external rotation also shows significant pain with internal and external rotation.  His left hip is stiff as well but not painful. Specialty Comments:  No specialty comments available.  Imaging: XR HIP UNILAT W OR W/O PELVIS 1V RIGHT  Result Date: 05/26/2019 An AP pelvis and lateral right hip show severe end-stage arthritis of  the right hip.  There is complete loss of the superior lateral joint space.  There are large periarticular osteophytes and flattening of the femoral head.  There is severe sclerotic changes well.  This is worsened significantly when compared to x-rays from February 2019.    PMFS History: Patient Active Problem List   Diagnosis Date Noted  . Unilateral primary osteoarthritis, right hip 05/26/2019  . Abnormal glucose 06/19/2016  . Family history of aneurysm 06/09/2014  . AR (allergic rhinitis) 05/20/2012  . Obturator hernia, right 08/13/2011  . Bilateral inguinal hernia (R>>L) 07/10/2011  . Diastasis recti 07/10/2011  . Hyperlipemia 10/21/2008   Past Medical History:  Diagnosis Date  .  Eczema   . Elevated cholesterol   . Hyperlipidemia   . Nasal congestion   . Right inguinal hernia     Family History  Problem Relation Age of Onset  . Diabetes Father   . Melanoma Father   . Hyperlipidemia Father   . Hypertension Mother   . Heart disease Father 62  . Aneurysm Mother 58    Past Surgical History:  Procedure Laterality Date  . HEMORRHOID SURGERY  2006 - approximate  . INGUINAL HERNIA REPAIR  07/24/2011   Procedure: LAPAROSCOPIC BILATERAL INGUINAL HERNIA REPAIR;  Surgeon: Ardeth Sportsman, MD;  Location: [facility];  Service: General;  Laterality: Bilateral;   Social History   Occupational History  . Occupation: Asst Manager    Comment: [employer]  Tobacco Use  . Smoking status: Former Smoker    Quit date: 05/21/1984    Years since quitting: 35.0  . Smokeless tobacco: Never Used  Substance and Sexual Activity  . Alcohol use: Yes    Alcohol/week: 2.0 standard drinks    Types: 2 drink(s) per week    Comment: 5-6 beers per week  . Drug use: No  . Sexual activity: Yes    Partners: Female

## 2019-07-03 ENCOUNTER — Telehealth: Payer: Self-pay | Admitting: Family Medicine

## 2019-07-03 DIAGNOSIS — R7309 Other abnormal glucose: Secondary | ICD-10-CM

## 2019-07-03 DIAGNOSIS — Z0000 Encounter for general adult medical examination without abnormal findings: Secondary | ICD-10-CM

## 2019-07-03 DIAGNOSIS — E785 Hyperlipidemia, unspecified: Secondary | ICD-10-CM

## 2019-07-03 NOTE — Addendum Note (Signed)
Addended by: Jed Limerick on: 07/03/2019 10:31 AM   Modules accepted: Orders

## 2019-07-03 NOTE — Telephone Encounter (Signed)
CBC, CMP, and lipid pended 

## 2019-07-03 NOTE — Addendum Note (Signed)
Addended by: Nani Gasser D on: 07/03/2019 11:04 AM   Modules accepted: Orders

## 2019-07-03 NOTE — Telephone Encounter (Signed)
PT requested Bloodwork for his annual physical. He is coming in on 2/15 to complete it.

## 2019-07-03 NOTE — Telephone Encounter (Signed)
Labs signed

## 2019-07-07 LAB — LIPID PANEL W/REFLEX DIRECT LDL
Cholesterol: 152 mg/dL (ref <200)
HDL: 34 mg/dL — ABNORMAL LOW (ref >=40)
LDL Cholesterol (Calc): 93 mg/dL (calc)
Non-HDL Cholesterol (Calc): 118 mg/dL (calc) (ref <130)
Total CHOL/HDL Ratio: 4.5 (calc) (ref <5.0)
Triglycerides: 145 mg/dL (ref <150)

## 2019-07-07 LAB — COMPLETE METABOLIC PANEL WITH GFR
AG Ratio: 2.7 (calc) — ABNORMAL HIGH (ref 1.0–2.5)
ALT: 27 U/L (ref 9–46)
AST: 21 U/L (ref 10–35)
Albumin: 4.8 g/dL (ref 3.6–5.1)
Alkaline phosphatase (APISO): 50 U/L (ref 35–144)
BUN: 13 mg/dL (ref 7–25)
CO2: 28 mmol/L (ref 20–32)
Calcium: 9.7 mg/dL (ref 8.6–10.3)
Chloride: 107 mmol/L (ref 98–110)
Creat: 1 mg/dL (ref 0.70–1.33)
GFR, Est African American: 95 mL/min/{1.73_m2} (ref >=60)
GFR, Est Non African American: 82 mL/min/{1.73_m2} (ref >=60)
Globulin: 1.8 g/dL (calc) — ABNORMAL LOW (ref 1.9–3.7)
Glucose, Bld: 90 mg/dL (ref 65–99)
Potassium: 4.5 mmol/L (ref 3.5–5.3)
Sodium: 142 mmol/L (ref 135–146)
Total Bilirubin: 0.7 mg/dL (ref 0.2–1.2)
Total Protein: 6.6 g/dL (ref 6.1–8.1)

## 2019-07-07 LAB — CBC
HCT: 47.6 % (ref 38.5–50.0)
Hemoglobin: 16.1 g/dL (ref 13.2–17.1)
MCH: 29.4 pg (ref 27.0–33.0)
MCHC: 33.8 g/dL (ref 32.0–36.0)
MCV: 86.9 fL (ref 80.0–100.0)
MPV: 10.3 fL (ref 7.5–12.5)
Platelets: 202 10*3/uL (ref 140–400)
RBC: 5.48 10*6/uL (ref 4.20–5.80)
RDW: 13.2 % (ref 11.0–15.0)
WBC: 5.4 10*3/uL (ref 3.8–10.8)

## 2019-07-08 ENCOUNTER — Other Ambulatory Visit: Payer: Self-pay

## 2019-07-08 DIAGNOSIS — E785 Hyperlipidemia, unspecified: Secondary | ICD-10-CM

## 2019-07-08 MED ORDER — ATORVASTATIN CALCIUM 40 MG PO TABS: 40.0000 mg | ORAL_TABLET | Freq: Every day | ORAL | 3 refills | Status: DC

## 2019-07-08 MED ORDER — FENOFIBRATE 200 MG PO CAPS: 200.0000 mg | ORAL_CAPSULE | Freq: Every day | ORAL | 3 refills | Status: DC

## 2019-07-13 ENCOUNTER — Other Ambulatory Visit: Payer: Self-pay

## 2019-07-13 ENCOUNTER — Telehealth: Payer: Self-pay | Admitting: Orthopaedic Surgery

## 2019-07-13 ENCOUNTER — Encounter: Payer: Self-pay | Admitting: Family Medicine

## 2019-07-13 ENCOUNTER — Ambulatory Visit (INDEPENDENT_AMBULATORY_CARE_PROVIDER_SITE_OTHER): Payer: BC Managed Care – PPO | Admitting: Family Medicine

## 2019-07-13 VITALS — BP 133/75 | HR 71 | Ht 71.0 in | Wt 235.0 lb

## 2019-07-13 DIAGNOSIS — Z0000 Encounter for general adult medical examination without abnormal findings: Secondary | ICD-10-CM

## 2019-07-13 DIAGNOSIS — R143 Flatulence: Secondary | ICD-10-CM

## 2019-07-13 DIAGNOSIS — L723 Sebaceous cyst: Secondary | ICD-10-CM | POA: Diagnosis not present

## 2019-07-13 DIAGNOSIS — M25551 Pain in right hip: Secondary | ICD-10-CM

## 2019-07-13 NOTE — Patient Instructions (Addendum)
Can try Align probiotic daily for 8 weeks.  Also try to cut out all dairy for at least 2-3 weeks to see if that helps.    Health Maintenance, Male Adopting a healthy lifestyle and getting preventive care are important in promoting health and wellness. Ask your health care provider about:  The right schedule for you to have regular tests and exams.  Things you can do on your own to prevent diseases and keep yourself healthy. What should I know about diet, weight, and exercise? Eat a healthy diet   Eat a diet that includes plenty of vegetables, fruits, low-fat dairy products, and lean protein.  Do not eat a lot of foods that are high in solid fats, added sugars, or sodium. Maintain a healthy weight Body mass index (BMI) is a measurement that can be used to identify possible weight problems. It estimates body fat based on height and weight. Your health care provider can help determine your BMI and help you achieve or maintain a healthy weight. Get regular exercise Get regular exercise. This is one of the most important things you can do for your health. Most adults should:  Exercise for at least 150 minutes each week. The exercise should increase your heart rate and make you sweat (moderate-intensity exercise).  Do strengthening exercises at least twice a week. This is in addition to the moderate-intensity exercise.  Spend less time sitting. Even light physical activity can be beneficial. Watch cholesterol and blood lipids Have your blood tested for lipids and cholesterol at 60 years of age, then have this test every 5 years. You may need to have your cholesterol levels checked more often if:  Your lipid or cholesterol levels are high.  You are older than 60 years of age.  You are at high risk for heart disease. What should I know about cancer screening? Many types of cancers can be detected early and may often be prevented. Depending on your health history and family history, you may  need to have cancer screening at various ages. This may include screening for:  Colorectal cancer.  Prostate cancer.  Skin cancer.  Lung cancer. What should I know about heart disease, diabetes, and high blood pressure? Blood pressure and heart disease  High blood pressure causes heart disease and increases the risk of stroke. This is more likely to develop in people who have high blood pressure readings, are of African descent, or are overweight.  Talk with your health care provider about your target blood pressure readings.  Have your blood pressure checked: ? Every 3-5 years if you are 44-81 years of age. ? Every year if you are 44 years old or older.  If you are between the ages of 21 and 47 and are a current or former smoker, ask your health care provider if you should have a one-time screening for abdominal aortic aneurysm (AAA). Diabetes Have regular diabetes screenings. This checks your fasting blood sugar level. Have the screening done:  Once every three years after age 58 if you are at a normal weight and have a low risk for diabetes.  More often and at a younger age if you are overweight or have a high risk for diabetes. What should I know about preventing infection? Hepatitis B If you have a higher risk for hepatitis B, you should be screened for this virus. Talk with your health care provider to find out if you are at risk for hepatitis B infection. Hepatitis C Blood testing is  recommended for:  Everyone born from [birth year] through 1966.  Anyone with known risk factors for hepatitis C. Sexually transmitted infections (STIs)  You should be screened each year for STIs, including gonorrhea and chlamydia, if: ? You are sexually active and are younger than 60 years of age. ? You are older than 60 years of age and your health care provider tells you that you are at risk for this type of infection. ? Your sexual activity has changed since you were last screened, and you are  at increased risk for chlamydia or gonorrhea. Ask your health care provider if you are at risk.  Ask your health care provider about whether you are at high risk for HIV. Your health care provider may recommend a prescription medicine to help prevent HIV infection. If you choose to take medicine to prevent HIV, you should first get tested for HIV. You should then be tested every 3 months for as long as you are taking the medicine. Follow these instructions at home: Lifestyle  Do not use any products that contain nicotine or tobacco, such as cigarettes, e-cigarettes, and chewing tobacco. If you need help quitting, ask your health care provider.  Do not use street drugs.  Do not share needles.  Ask your health care provider for help if you need support or information about quitting drugs. Alcohol use  Do not drink alcohol if your health care provider tells you not to drink.  If you drink alcohol: ? Limit how much you have to 0-2 drinks a day. ? Be aware of how much alcohol is in your drink. In the [place] one drink equals one 12 oz bottle of beer (355 mL), one 5 oz glass of wine (148 mL), or one 1 oz glass of hard liquor (44 mL). General instructions  Schedule regular health, dental, and eye exams.  Stay current with your vaccines.  Tell your health care provider if: ? You often feel depressed. ? You have ever been abused or do not feel safe at home. Summary  Adopting a healthy lifestyle and getting preventive care are important in promoting health and wellness.  Follow your health care provider's instructions about healthy diet, exercising, and getting tested or screened for diseases.  Follow your health care provider's instructions on monitoring your cholesterol and blood pressure. This information is not intended to replace advice given to you by your health care provider. Make sure you discuss any questions you have with your health care provider. Document Revised: 04/30/2018  Document Reviewed: 04/30/2018 Elsevier Patient Education  2020 [employer].

## 2019-07-13 NOTE — Telephone Encounter (Signed)
Called patient reference mychart message concerning needing shot in hip to reduce pain and increase mobility with Dr Magnus Ivan per patient

## 2019-07-13 NOTE — Progress Notes (Addendum)
CPE  Established Patient Office Visit  Subjective:  Patient ID: Cody Huber, male    DOB: [birth year]  Age: 59 y.o. MRN: [id number]  CC:  Chief Complaint  Patient presents with  . Annual Exam    HPI [name] presents for WEllness Exam.  He is doing well overall he is staying active.  He did have his blood work drawn and reviewed those and did have a couple of questions.  His globulin was borderline low.  And unfortunately his PSA was not drawn.  He also has a sebaceous cyst on his upper back that is been treated previously and seems to be coming back.  He also wanted let me know he is can probably have a right hip replacement sometime towards the end of the year.  He really was originally to have it last year but because of Covid it got delayed.  He is also been having some problems with excess gas and wanted to know if I had any suggestions or recommendations.  He has tried things like Gas-X and Beano without any significant relief.   Past Medical History:  Diagnosis Date  . Eczema   . Elevated cholesterol   . Hyperlipidemia   . Nasal congestion   . Right inguinal hernia     Past Surgical History:  Procedure Laterality Date  . HEMORRHOID SURGERY  2006 - approximate  . INGUINAL HERNIA REPAIR  07/24/2011   Procedure: LAPAROSCOPIC BILATERAL INGUINAL HERNIA REPAIR;  Surgeon: Ardeth Sportsman, MD;  Location: [facility];  Service: General;  Laterality: Bilateral;    Family History  Problem Relation Age of Onset  . Diabetes Father   . Melanoma Father   . Hyperlipidemia Father   . Hypertension Mother   . Heart disease Father 25  . Aneurysm Mother 11    Social History   Socioeconomic History  . Marital status: Married    Spouse name: Misty Stanley  . Number of children: 0  . Years of education: BS  . Highest education level: Not on file  Occupational History  . Occupation: Asst Manager    Comment: [employer]  Tobacco Use  . Smoking status: Former Smoker    Quit date:  05/21/1984    Years since quitting: 35.1  . Smokeless tobacco: Never Used  Substance and Sexual Activity  . Alcohol use: Yes    Alcohol/week: 2.0 standard drinks    Types: 2 drink(s) per week    Comment: 5-6 beers per week  . Drug use: No  . Sexual activity: Yes    Partners: Female  Other Topics Concern  . Not on file  Social History Narrative   Regular exercise. Walking 6 days per week. Walks aobut a mile per day.    Social Determinants of Health   Financial Resource Strain:   . Difficulty of Paying Living Expenses: Not on file  Food Insecurity:   . Worried About Programme researcher, broadcasting/film/video in the Last Year: Not on file  . Ran Out of Food in the Last Year: Not on file  Transportation Needs:   . Lack of Transportation (Medical): Not on file  . Lack of Transportation (Non-Medical): Not on file  Physical Activity:   . Days of Exercise per Week: Not on file  . Minutes of Exercise per Session: Not on file  Stress:   . Feeling of Stress : Not on file  Social Connections:   . Frequency of Communication with Friends and Family: Not on file  . Frequency  of Social Gatherings with Friends and Family: Not on file  . Attends Religious Services: Not on file  . Active Member of Clubs or Organizations: Not on file  . Attends Banker Meetings: Not on file  . Marital Status: Not on file  Intimate Partner Violence:   . Fear of Current or Ex-Partner: Not on file  . Emotionally Abused: Not on file  . Physically Abused: Not on file  . Sexually Abused: Not on file    Outpatient Medications Prior to Visit  Medication Sig Dispense Refill  . atorvastatin (LIPITOR) 40 MG tablet Take 1 tablet (40 mg total) by mouth daily. 90 tablet 3  . clobetasol cream (TEMOVATE) 0.05 % Apply 1 application topically daily as needed. 45 g 1  . fenofibrate micronized (LOFIBRA) 200 MG capsule Take 1 capsule (200 mg total) by mouth at bedtime. 90 capsule 3  . fish oil-omega-3 fatty acids 1000 MG capsule Take 3  g by mouth at bedtime.     . Psyllium (METAMUCIL PO) Take by mouth at bedtime. Patient takes 1 tablespoonful per day     No facility-administered medications prior to visit.    No Known Allergies  ROS Review of Systems    Objective:    Physical Exam  Constitutional: He is oriented to person, place, and time. He appears well-developed and well-nourished.  HENT:  Head: Normocephalic and atraumatic.  Right Ear: External ear normal.  Left Ear: External ear normal.  Nose: Nose normal.  Mouth/Throat: Oropharynx is clear and moist.  Eyes: Pupils are equal, round, and reactive to light. Conjunctivae and EOM are normal.  Neck: No thyromegaly present.  Cardiovascular: Normal rate, regular rhythm, normal heart sounds and intact distal pulses.  Pulmonary/Chest: Effort normal and breath sounds normal.  Abdominal: Soft. Bowel sounds are normal. He exhibits no distension and no mass. There is no abdominal tenderness. There is no rebound and no guarding.  Musculoskeletal:        General: Normal range of motion.     Cervical back: Normal range of motion and neck supple.  Lymphadenopathy:    He has no cervical adenopathy.  Neurological: He is alert and oriented to person, place, and time. He has normal reflexes.  Skin: Skin is warm and dry.  Psychiatric: He has a normal mood and affect. His behavior is normal. Judgment and thought content normal.    BP 133/75   Pulse 71   Ht 5\' 11"  (1.803 m)   Wt 235 lb (106.6 kg)   SpO2 98%   BMI 32.78 kg/m  Wt Readings from Last 3 Encounters:  07/13/19 235 lb (106.6 kg)  05/26/19 228 lb (103.4 kg)  07/01/18 230 lb (104.3 kg)     Health Maintenance Due  Topic Date Due  . COLONOSCOPY  07/21/2018  . TETANUS/TDAP  04/22/2019    There are no preventive care reminders to display for this patient.  No results found for: TSH Lab Results  Component Value Date   WBC 5.4 07/07/2019   HGB 16.1 07/07/2019   HCT 47.6 07/07/2019   MCV 86.9  07/07/2019   PLT 202 07/07/2019   Lab Results  Component Value Date   NA 142 07/07/2019   K 4.5 07/07/2019   CO2 28 07/07/2019   GLUCOSE 90 07/07/2019   BUN 13 07/07/2019   CREATININE 1.00 07/07/2019   BILITOT 0.7 07/07/2019   ALKPHOS 48 06/12/2016   AST 21 07/07/2019   ALT 27 07/07/2019   PROT 6.6  07/07/2019   ALBUMIN 4.4 06/12/2016   CALCIUM 9.7 07/07/2019   Lab Results  Component Value Date   CHOL 152 07/07/2019   Lab Results  Component Value Date   HDL 34 (L) 07/07/2019   Lab Results  Component Value Date   LDLCALC 93 07/07/2019   Lab Results  Component Value Date   TRIG 145 07/07/2019   Lab Results  Component Value Date   CHOLHDL 4.5 07/07/2019   Lab Results  Component Value Date   HGBA1C 5.4 02/26/2017      Assessment & Plan:   Problem List Items Addressed This Visit    None    Visit Diagnoses    Wellness examination    -  Primary   Relevant Orders   PSA   Sebaceous cyst       Relevant Orders   Ambulatory referral to Dermatology   Excessive gas         Keep up a regular exercise program and make sure you are eating a healthy diet Try to eat 4 servings of dairy a day, or if you are lactose intolerant take a calcium with vitamin D daily.  Your vaccines are up to date.  Get PSA drawn today.  He plans on scheduling his colonoscopy this year.  Sebaceous cyst on right upper back-we will refer to dermatology for full excision.  He is actually had it removed previously.  GAS-discussed trial of a probiotic to see if that is helpful.  Also cutting out dairy for a few weeks to see if that is helpful as well..   No orders of the defined types were placed in this encounter.   Follow-up: Return in about 1 year (around 07/12/2020) for Wellness Exam.    Beatrice Lecher, MD

## 2019-07-13 NOTE — Telephone Encounter (Signed)
Patient called.   He is wanting to see about getting set up for another injection with [facility] .  Call back number: [phone]

## 2019-07-13 NOTE — Addendum Note (Signed)
Addended by: Deno Etienne on: 07/13/2019 10:51 AM   Modules accepted: Orders

## 2019-07-13 NOTE — Telephone Encounter (Signed)
Order put in the chart

## 2019-07-13 NOTE — Telephone Encounter (Signed)
His last hip injection was in June 2020.  I am fine with Dr. Alvester Morin providing another steroid injection in his hip joint at this standpoint.

## 2019-07-13 NOTE — Telephone Encounter (Signed)
Please advise 

## 2019-07-14 LAB — PSA: PSA: 0.7 ng/mL (ref <=4.0)

## 2019-07-22 ENCOUNTER — Ambulatory Visit (INDEPENDENT_AMBULATORY_CARE_PROVIDER_SITE_OTHER): Payer: BC Managed Care – PPO | Admitting: Family Medicine

## 2019-07-22 ENCOUNTER — Other Ambulatory Visit: Payer: Self-pay

## 2019-07-22 VITALS — Temp 97.5°F

## 2019-07-22 DIAGNOSIS — Z23 Encounter for immunization: Secondary | ICD-10-CM | POA: Diagnosis not present

## 2019-07-22 NOTE — Progress Notes (Signed)
Agree with documentation as above.   Catherine Metheney, MD  

## 2019-07-22 NOTE — Progress Notes (Signed)
   Subjective:    Patient ID: Cody Huber, male    DOB: 13-Jul-1960, 59 y.o.   MRN: [id number]  HPI Patient presents to office for Tdap vaccine. Patient denies any fevers in the last week.    Review of Systems     Objective:   Physical Exam        Assessment & Plan:  Patient tolerated vaccine well without any immediate reactions.Advised he will not be due for next booster for 10 years, unless any cuts, scrapes, injuries, etc.

## 2019-07-28 ENCOUNTER — Ambulatory Visit (INDEPENDENT_AMBULATORY_CARE_PROVIDER_SITE_OTHER): Payer: BC Managed Care – PPO | Admitting: Physical Medicine and Rehabilitation

## 2019-07-28 ENCOUNTER — Encounter: Payer: Self-pay | Admitting: Physical Medicine and Rehabilitation

## 2019-07-28 ENCOUNTER — Ambulatory Visit: Payer: Self-pay

## 2019-07-28 ENCOUNTER — Other Ambulatory Visit: Payer: Self-pay

## 2019-07-28 DIAGNOSIS — M25551 Pain in right hip: Secondary | ICD-10-CM

## 2019-07-28 NOTE — Progress Notes (Signed)
 .  Numeric Pain Rating Scale and Functional Assessment Average Pain 6   In the last MONTH (on 0-10 scale) has pain interfered with the following?  1. General activity like being  able to carry out your everyday physical activities such as walking, climbing stairs, carrying groceries, or moving a chair?  Rating(7)   -Dye Allergies.  

## 2019-07-28 NOTE — Progress Notes (Signed)
   Cody Huber - 59 y.o. male MRN [id number]  Date of birth: 1960-09-17  Office Visit Note: Visit Date: 07/28/2019 PCP: Agapito Games, MD Referred by: Agapito Games, *  Subjective: Chief Complaint  Patient presents with  . Right Hip - Pain   HPI:  Cody Huber is a 59 y.o. male who comes in today For planned repeat right intra-articular hip injection with fluoroscopic guidance.  Last injection was in June and that did pretty well up until better than the pain gradually returned.  He gets worsening pain with weightbearing and movement.  Does have right hip and groin pain consistent with hip pathology.  He does have end-stage osteoarthritis of the right hip.  He is returning to see Dr. Magnus Ivan looking possible replacement of the hip this coming year.  ROS Otherwise per HPI.  Assessment & Plan: Visit Diagnoses:  1. Pain in right hip     Plan: No additional findings.   Meds & Orders: No orders of the defined types were placed in this encounter.   Orders Placed This Encounter  Procedures  . Large Joint Inj: R hip joint  . XR C-ARM NO REPORT    Follow-up: No follow-ups on file.   Procedures: Large Joint Inj: R hip joint (Right) on 07/28/2019 2:49 PM Indications: diagnostic evaluation and pain Details: 22 G 3.5 in needle, fluoroscopy-guided anterior approach  Arthrogram: No  Medications: 4 mL bupivacaine 0.25 %; 60 mg triamcinolone acetonide 40 MG/ML Outcome: tolerated well, no immediate complications  There was excellent flow of contrast producing a partial arthrogram of the hip. The patient did have relief of symptoms during the anesthetic phase of the injection. Procedure, treatment alternatives, risks and benefits explained, specific risks discussed. Consent was given by the patient. Immediately prior to procedure a time out was called to verify the correct patient, procedure, equipment, support staff and site/side marked as required. Patient was prepped and  draped in the usual sterile fashion.      No notes on file   Clinical History: No specialty comments available.     Objective:  VS:  HT:    WT:   BMI:     BP:   HR: bpm  TEMP: ( )  RESP:  Physical Exam  Ortho Exam Imaging: XR C-ARM NO REPORT  Result Date: 07/28/2019 Please see Notes tab for imaging impression.

## 2019-07-29 MED ORDER — TRIAMCINOLONE ACETONIDE 40 MG/ML IJ SUSP
60.0000 mg | INTRAMUSCULAR | Status: AC | PRN
  Administered 2019-07-28: 60 mg via INTRA_ARTICULAR

## 2019-07-29 MED ORDER — BUPIVACAINE HCL 0.25 % IJ SOLN
4.0000 mL | INTRAMUSCULAR | Status: AC | PRN
  Administered 2019-07-28: 4 mL via INTRA_ARTICULAR

## 2019-08-06 ENCOUNTER — Encounter: Payer: Self-pay | Admitting: Orthopaedic Surgery

## 2019-08-12 ENCOUNTER — Encounter: Payer: Self-pay | Admitting: Orthopaedic Surgery

## 2019-08-18 ENCOUNTER — Telehealth: Payer: Self-pay | Admitting: Orthopaedic Surgery

## 2019-08-18 NOTE — Telephone Encounter (Signed)
Pt called in stating Dr. Magnus Ivan told him to call when he was ready to go forth with surgery; and so the pt would like to proceed with the next steps for surgery.   [phone]

## 2019-08-20 NOTE — Telephone Encounter (Signed)
I called patient and scheduled.

## 2019-08-28 ENCOUNTER — Other Ambulatory Visit: Payer: Self-pay

## 2019-09-08 NOTE — Patient Instructions (Addendum)
DUE TO COVID-19 ONLY ONE VISITOR IS ALLOWED TO COME WITH YOU AND STAY IN THE WAITING ROOM ONLY DURING PRE OP AND PROCEDURE DAY OF SURGERY. THE 1 VISITOR MAY VISIT WITH YOU AFTER SURGERY IN YOUR PRIVATE ROOM DURING VISITING HOURS ONLY!  YOU NEED TO HAVE A COVID 19 TEST ON 09-15-19 @ 2:50 PM, THIS TEST MUST BE DONE BEFORE SURGERY, COME  [address],  [place] , [id number].  [facility]) ONCE YOUR COVID TEST IS COMPLETED, PLEASE BEGIN THE QUARANTINE INSTRUCTIONS AS OUTLINED IN YOUR HANDOUT.                Cody Huber  09/08/2019   Your procedure is scheduled on: 09-18-19   Report to [facility] Main  Entrance    Report to Admitting at 0600 AM     Call this number if you have problems the morning of surgery [phone]    Remember: Do not eat food or drink liquids :After Midnight.    Take these medicines the morning of surgery with A SIP OF WATER: None    BRUSH YOUR TEETH MORNING OF SURGERY AND RINSE YOUR MOUTH OUT, NO CHEWING GUM CANDY OR MINTS.                                  You may not have any metal on your body including hair pins and              piercings     Do not wear jewelry, cologne, lotions, powders or deodorant              Men may shave face and neck.   Do not bring valuables to the hospital. [place] IS NOT             RESPONSIBLE   FOR VALUABLES.  Contacts, dentures or bridgework may not be worn into surgery.   You may bring overnight bag    Special Instructions: N/A              Please read over the following fact sheets you were given: _____________________________________________________________________             [facility] - Preparing for Surgery Before surgery, you can play an important role.  Because skin is not sterile, your skin needs to be as free of germs as possible.  You can reduce the number of germs on your skin by washing with CHG (chlorahexidine gluconate) soap before surgery.  CHG is an antiseptic  cleaner which kills germs and bonds with the skin to continue killing germs even after washing. Please DO NOT use if you have an allergy to CHG or antibacterial soaps.  If your skin becomes reddened/irritated stop using the CHG and inform your nurse when you arrive at Short Stay. Do not shave (including legs and underarms) for at least 48 hours prior to the first CHG shower.  You may shave your face/neck. Please follow these instructions carefully:  1.  Shower with CHG Soap the night before surgery and the  morning of Surgery.  2.  If you choose to wash your hair, wash your hair first as usual with your  normal  shampoo.  3.  After you shampoo, rinse your hair and body thoroughly to remove the  shampoo.  4.  Use CHG as you would any other liquid soap.  You can apply chg directly  to the skin and wash                       Gently with a scrungie or clean washcloth.  5.  Apply the CHG Soap to your body ONLY FROM THE NECK DOWN.   Do not use on face/ open                           Wound or open sores. Avoid contact with eyes, ears mouth and genitals (private parts).                       Wash face,  Genitals (private parts) with your normal soap.             6.  Wash thoroughly, paying special attention to the area where your surgery  will be performed.  7.  Thoroughly rinse your body with warm water from the neck down.  8.  DO NOT shower/wash with your normal soap after using and rinsing off  the CHG Soap.                9.  Pat yourself dry with a clean towel.            10.  Wear clean pajamas.            11.  Place clean sheets on your bed the night of your first shower and do not  sleep with pets. Day of Surgery : Do not apply any lotions/deodorants the morning of surgery.  Please wear clean clothes to the hospital/surgery center.  FAILURE TO FOLLOW THESE INSTRUCTIONS MAY RESULT IN THE CANCELLATION OF YOUR SURGERY PATIENT  SIGNATURE_________________________________  NURSE SIGNATURE__________________________________  ________________________________________________________________________

## 2019-09-08 NOTE — Progress Notes (Signed)
PCP - Nani Gasser, MD Cardiologist -   Chest x-ray -  EKG -  Stress Test -  ECHO -  Cardiac Cath -   Sleep Study -  CPAP -   Fasting Blood Sugar -  Checks Blood Sugar _____ times a day  Blood Thinner Instructions: Aspirin Instructions: Last Dose:  Anesthesia review:   Patient denies shortness of breath, fever, cough and chest pain at PAT appointment   Patient verbalized understanding of instructions that were given to them at the PAT appointment. Patient was also instructed that they will need to review over the PAT instructions again at home before surgery.

## 2019-09-08 NOTE — Progress Notes (Signed)
Please place surgery orders.  

## 2019-09-11 ENCOUNTER — Other Ambulatory Visit: Payer: Self-pay

## 2019-09-11 ENCOUNTER — Encounter (HOSPITAL_COMMUNITY)
Admission: RE | Admit: 2019-09-11 | Discharge: 2019-09-11 | Disposition: A | Discharge status: Home / Self Care | Payer: BC Managed Care – PPO | Source: Ambulatory Visit | Attending: Orthopaedic Surgery | Admitting: Orthopaedic Surgery

## 2019-09-11 ENCOUNTER — Encounter (HOSPITAL_COMMUNITY): Payer: Self-pay

## 2019-09-14 ENCOUNTER — Encounter (HOSPITAL_COMMUNITY): Payer: BC Managed Care – PPO

## 2019-09-14 ENCOUNTER — Other Ambulatory Visit: Payer: Self-pay | Admitting: Physician Assistant

## 2019-09-15 ENCOUNTER — Encounter (HOSPITAL_COMMUNITY)
Admission: RE | Admit: 2019-09-15 | Discharge: 2019-09-15 | Disposition: A | Discharge status: Home / Self Care | Payer: BC Managed Care – PPO | Source: Ambulatory Visit | Attending: Orthopaedic Surgery | Admitting: Orthopaedic Surgery

## 2019-09-15 ENCOUNTER — Other Ambulatory Visit: Payer: Self-pay

## 2019-09-15 ENCOUNTER — Other Ambulatory Visit (HOSPITAL_COMMUNITY)
Admission: RE | Admit: 2019-09-15 | Discharge: 2019-09-15 | Disposition: A | Discharge status: Home / Self Care | Payer: BC Managed Care – PPO | Source: Ambulatory Visit | Attending: Orthopaedic Surgery | Admitting: Orthopaedic Surgery

## 2019-09-15 DIAGNOSIS — Z01812 Encounter for preprocedural laboratory examination: Secondary | ICD-10-CM | POA: Insufficient documentation

## 2019-09-15 DIAGNOSIS — Z20822 Contact with and (suspected) exposure to covid-19: Secondary | ICD-10-CM | POA: Diagnosis not present

## 2019-09-15 LAB — CBC
HCT: 47.8 % (ref 39.0–52.0)
Hemoglobin: 15.6 g/dL (ref 13.0–17.0)
MCH: 28.8 pg (ref 26.0–34.0)
MCHC: 32.6 g/dL (ref 30.0–36.0)
MCV: 88.2 fL (ref 80.0–100.0)
Platelets: 214 10*3/uL (ref 150–400)
RBC: 5.42 MIL/uL (ref 4.22–5.81)
RDW: 13.3 % (ref 11.5–15.5)
WBC: 6.4 10*3/uL (ref 4.0–10.5)
nRBC: 0 % (ref 0.0–0.2)

## 2019-09-15 LAB — BASIC METABOLIC PANEL
Anion gap: 9 (ref 5–15)
BUN: 17 mg/dL (ref 6–20)
CO2: 24 mmol/L (ref 22–32)
Calcium: 9.6 mg/dL (ref 8.9–10.3)
Chloride: 108 mmol/L (ref 98–111)
Creatinine, Ser: 0.91 mg/dL (ref 0.61–1.24)
GFR calc Af Amer: >60 mL/min (ref >60)
GFR calc non Af Amer: >60 mL/min (ref >60)
Glucose, Bld: 104 mg/dL — ABNORMAL HIGH (ref 70–99)
Potassium: 3.9 mmol/L (ref 3.5–5.1)
Sodium: 141 mmol/L (ref 135–145)

## 2019-09-15 LAB — ABO/RH: ABO/RH(D): A POS

## 2019-09-15 LAB — SURGICAL PCR SCREEN
MRSA, PCR: NEGATIVE
Staphylococcus aureus: NEGATIVE

## 2019-09-15 LAB — SARS CORONAVIRUS 2 (TAT 6-24 HRS): SARS Coronavirus 2: NEGATIVE

## 2019-09-17 NOTE — Anesthesia Preprocedure Evaluation (Addendum)
Anesthesia Evaluation  Patient identified by MRN, date of birth, ID band Patient awake    Reviewed: Allergy & Precautions, NPO status , Patient's Chart, lab work & pertinent test results  History of Anesthesia Complications Negative for: history of anesthetic complications  Airway Mallampati: I  TM Distance: >3 FB Neck ROM: Full    Dental  (+) Chipped, Dental Advisory Given, Caps   Pulmonary former smoker,  09/15/2019 SARS coronavirus NEG   breath sounds clear to auscultation       Cardiovascular negative cardio ROS   Rhythm:Regular Rate:Normal     Neuro/Psych negative neurological ROS     GI/Hepatic negative GI ROS, Neg liver ROS,   Endo/Other  obese  Renal/GU negative Renal ROS     Musculoskeletal  (+) Arthritis , Osteoarthritis,    Abdominal (+) + obese,   Peds  Hematology negative hematology ROS (+)   Anesthesia Other Findings   Reproductive/Obstetrics                            Anesthesia Physical Anesthesia Plan  ASA: II  Anesthesia Plan: Spinal   Post-op Pain Management:    Induction:   PONV Risk Score and Plan: 1 and Treatment may vary due to age or medical condition  Airway Management Planned: Natural Airway and Simple Face Mask  Additional Equipment:   Intra-op Plan:   Post-operative Plan:   Informed Consent: I have reviewed the patients History and Physical, chart, labs and discussed the procedure including the risks, benefits and alternatives for the proposed anesthesia with the patient or authorized representative who has indicated his/her understanding and acceptance.     Dental advisory given  Plan Discussed with: CRNA and Surgeon  Anesthesia Plan Comments:        Anesthesia Quick Evaluation

## 2019-09-18 ENCOUNTER — Ambulatory Visit (HOSPITAL_COMMUNITY): Payer: BC Managed Care – PPO

## 2019-09-18 ENCOUNTER — Other Ambulatory Visit: Payer: Self-pay

## 2019-09-18 ENCOUNTER — Inpatient Hospital Stay (HOSPITAL_COMMUNITY)
Admission: AD | Admit: 2019-09-18 | Discharge: 2019-09-20 | DRG: 470 | Disposition: A | Discharge status: Home Health | Payer: BC Managed Care – PPO | Attending: Orthopaedic Surgery | Admitting: Orthopaedic Surgery

## 2019-09-18 ENCOUNTER — Encounter (HOSPITAL_COMMUNITY)
Admission: AD | Disposition: A | Discharge status: Home Health | Payer: Self-pay | Source: Home / Self Care | Attending: Orthopaedic Surgery

## 2019-09-18 ENCOUNTER — Encounter (HOSPITAL_COMMUNITY): Payer: Self-pay | Admitting: Orthopaedic Surgery

## 2019-09-18 ENCOUNTER — Ambulatory Visit (HOSPITAL_COMMUNITY): Payer: BC Managed Care – PPO | Admitting: Anesthesiology

## 2019-09-18 ENCOUNTER — Observation Stay (HOSPITAL_COMMUNITY): Payer: BC Managed Care – PPO

## 2019-09-18 DIAGNOSIS — E785 Hyperlipidemia, unspecified: Secondary | ICD-10-CM | POA: Diagnosis present

## 2019-09-18 DIAGNOSIS — Z20822 Contact with and (suspected) exposure to covid-19: Secondary | ICD-10-CM | POA: Diagnosis present

## 2019-09-18 DIAGNOSIS — Z96641 Presence of right artificial hip joint: Secondary | ICD-10-CM

## 2019-09-18 DIAGNOSIS — E669 Obesity, unspecified: Secondary | ICD-10-CM | POA: Diagnosis present

## 2019-09-18 DIAGNOSIS — M1611 Unilateral primary osteoarthritis, right hip: Principal | ICD-10-CM | POA: Diagnosis present

## 2019-09-18 DIAGNOSIS — Z6831 Body mass index (BMI) 31.0-31.9, adult: Secondary | ICD-10-CM

## 2019-09-18 DIAGNOSIS — E7800 Pure hypercholesterolemia, unspecified: Secondary | ICD-10-CM | POA: Diagnosis present

## 2019-09-18 DIAGNOSIS — Z87891 Personal history of nicotine dependence: Secondary | ICD-10-CM

## 2019-09-18 DIAGNOSIS — M25551 Pain in right hip: Secondary | ICD-10-CM

## 2019-09-18 HISTORY — PX: TOTAL HIP ARTHROPLASTY: SHX124

## 2019-09-18 LAB — TYPE AND SCREEN
ABO/RH(D): A POS
Antibody Screen: NEGATIVE

## 2019-09-18 SURGERY — ARTHROPLASTY, HIP, TOTAL, ANTERIOR APPROACH
Anesthesia: Spinal | Site: Hip | Laterality: Right

## 2019-09-18 MED ORDER — FENOFIBRATE 160 MG PO TABS
160.0000 mg | ORAL_TABLET | Freq: Every day | ORAL | Status: DC
  Administered 2019-09-18 – 2019-09-19 (×2): 160 mg via ORAL
  Filled 2019-09-18 (×2): qty 1

## 2019-09-18 MED ORDER — METOCLOPRAMIDE HCL 5 MG PO TABS: 5.0000 mg | ORAL_TABLET | Freq: Three times a day (TID) | ORAL | Status: DC | PRN

## 2019-09-18 MED ORDER — ATORVASTATIN CALCIUM 40 MG PO TABS
40.0000 mg | ORAL_TABLET | Freq: Every day | ORAL | Status: DC
  Administered 2019-09-18 – 2019-09-19 (×2): 40 mg via ORAL
  Filled 2019-09-18 (×2): qty 1

## 2019-09-18 MED ORDER — OXYCODONE HCL 5 MG PO TABS: 10.0000 mg | ORAL_TABLET | ORAL | Status: DC | PRN

## 2019-09-18 MED ORDER — ONDANSETRON HCL 4 MG/2ML IJ SOLN
INTRAMUSCULAR | Status: AC
  Filled 2019-09-18: qty 2

## 2019-09-18 MED ORDER — METHOCARBAMOL 500 MG PO TABS
500.0000 mg | ORAL_TABLET | Freq: Four times a day (QID) | ORAL | Status: DC | PRN
  Administered 2019-09-18 – 2019-09-20 (×5): 500 mg via ORAL
  Filled 2019-09-18 (×6): qty 1

## 2019-09-18 MED ORDER — METOCLOPRAMIDE HCL 5 MG/ML IJ SOLN: 5.0000 mg | Freq: Three times a day (TID) | INTRAMUSCULAR | Status: DC | PRN

## 2019-09-18 MED ORDER — POLYETHYLENE GLYCOL 3350 17 G PO PACK: 17.0000 g | PACK | Freq: Every day | ORAL | Status: DC | PRN

## 2019-09-18 MED ORDER — OXYCODONE HCL 5 MG PO TABS
5.0000 mg | ORAL_TABLET | ORAL | Status: DC | PRN
  Administered 2019-09-18 – 2019-09-19 (×2): 5 mg via ORAL
  Filled 2019-09-18 (×2): qty 1

## 2019-09-18 MED ORDER — MIDAZOLAM HCL 2 MG/2ML IJ SOLN
INTRAMUSCULAR | Status: AC
  Filled 2019-09-18: qty 2

## 2019-09-18 MED ORDER — SODIUM CHLORIDE 0.9 % IR SOLN
Status: DC | PRN
  Administered 2019-09-18: 1000 mL

## 2019-09-18 MED ORDER — MEPERIDINE HCL 50 MG/ML IJ SOLN: 6.2500 mg | INTRAMUSCULAR | Status: DC | PRN

## 2019-09-18 MED ORDER — ALUM & MAG HYDROXIDE-SIMETH 200-200-20 MG/5ML PO SUSP: 30.0000 mL | ORAL | Status: DC | PRN

## 2019-09-18 MED ORDER — ONDANSETRON HCL 4 MG/2ML IJ SOLN: 4.0000 mg | Freq: Four times a day (QID) | INTRAMUSCULAR | Status: DC | PRN

## 2019-09-18 MED ORDER — CEFAZOLIN SODIUM-DEXTROSE 2-4 GM/100ML-% IV SOLN
2.0000 g | INTRAVENOUS | Status: AC
  Administered 2019-09-18: 2 g via INTRAVENOUS
  Filled 2019-09-18: qty 100

## 2019-09-18 MED ORDER — DEXAMETHASONE SODIUM PHOSPHATE 10 MG/ML IJ SOLN
INTRAMUSCULAR | Status: DC | PRN
  Administered 2019-09-18: 10 mg via INTRAVENOUS

## 2019-09-18 MED ORDER — FENTANYL CITRATE (PF) 100 MCG/2ML IJ SOLN
INTRAMUSCULAR | Status: AC
  Filled 2019-09-18: qty 2

## 2019-09-18 MED ORDER — MENTHOL 3 MG MT LOZG: 1.0000 | LOZENGE | OROMUCOSAL | Status: DC | PRN

## 2019-09-18 MED ORDER — CEFAZOLIN SODIUM-DEXTROSE 1-4 GM/50ML-% IV SOLN
1.0000 g | Freq: Four times a day (QID) | INTRAVENOUS | Status: AC
  Administered 2019-09-18 (×2): 1 g via INTRAVENOUS
  Filled 2019-09-18 (×2): qty 50

## 2019-09-18 MED ORDER — PANTOPRAZOLE SODIUM 40 MG PO TBEC
40.0000 mg | DELAYED_RELEASE_TABLET | Freq: Every day | ORAL | Status: DC
  Administered 2019-09-18 – 2019-09-19 (×2): 40 mg via ORAL
  Filled 2019-09-18 (×2): qty 1

## 2019-09-18 MED ORDER — OXYCODONE HCL 5 MG/5ML PO SOLN: 5.0000 mg | Freq: Once | ORAL | Status: DC | PRN

## 2019-09-18 MED ORDER — TRANEXAMIC ACID-NACL 1000-0.7 MG/100ML-% IV SOLN
1000.0000 mg | INTRAVENOUS | Status: AC
  Administered 2019-09-18: 1000 mg via INTRAVENOUS
  Filled 2019-09-18: qty 100

## 2019-09-18 MED ORDER — PROPOFOL 500 MG/50ML IV EMUL
INTRAVENOUS | Status: DC | PRN
  Administered 2019-09-18: 75 ug/kg/min via INTRAVENOUS

## 2019-09-18 MED ORDER — ONDANSETRON HCL 4 MG/2ML IJ SOLN
INTRAMUSCULAR | Status: DC | PRN
  Administered 2019-09-18: 4 mg via INTRAVENOUS

## 2019-09-18 MED ORDER — PROPOFOL 10 MG/ML IV BOLUS
INTRAVENOUS | Status: AC
  Filled 2019-09-18: qty 20

## 2019-09-18 MED ORDER — LACTATED RINGERS IV SOLN: INTRAVENOUS | Status: DC

## 2019-09-18 MED ORDER — PROPOFOL 1000 MG/100ML IV EMUL
INTRAVENOUS | Status: AC
  Filled 2019-09-18: qty 100

## 2019-09-18 MED ORDER — HYDROMORPHONE HCL 1 MG/ML IJ SOLN: 0.5000 mg | INTRAMUSCULAR | Status: DC | PRN

## 2019-09-18 MED ORDER — DEXAMETHASONE SODIUM PHOSPHATE 10 MG/ML IJ SOLN
INTRAMUSCULAR | Status: AC
  Filled 2019-09-18: qty 1

## 2019-09-18 MED ORDER — DOCUSATE SODIUM 100 MG PO CAPS
100.0000 mg | ORAL_CAPSULE | Freq: Two times a day (BID) | ORAL | Status: DC
  Administered 2019-09-18 – 2019-09-20 (×4): 100 mg via ORAL
  Filled 2019-09-18 (×4): qty 1

## 2019-09-18 MED ORDER — MIDAZOLAM HCL 2 MG/2ML IJ SOLN: 0.5000 mg | Freq: Once | INTRAMUSCULAR | Status: DC | PRN

## 2019-09-18 MED ORDER — ACETAMINOPHEN 325 MG PO TABS
325.0000 mg | ORAL_TABLET | Freq: Four times a day (QID) | ORAL | Status: DC | PRN
  Administered 2019-09-18 – 2019-09-19 (×4): 650 mg via ORAL
  Filled 2019-09-18 (×4): qty 2

## 2019-09-18 MED ORDER — PHENOL 1.4 % MT LIQD: 1.0000 | OROMUCOSAL | Status: DC | PRN

## 2019-09-18 MED ORDER — PHENYLEPHRINE HCL-NACL 10-0.9 MG/250ML-% IV SOLN
INTRAVENOUS | Status: AC
  Filled 2019-09-18: qty 250

## 2019-09-18 MED ORDER — METHOCARBAMOL 1000 MG/10ML IJ SOLN
500.0000 mg | Freq: Four times a day (QID) | INTRAVENOUS | Status: DC | PRN
  Filled 2019-09-18: qty 5

## 2019-09-18 MED ORDER — LIDOCAINE 2% (20 MG/ML) 5 ML SYRINGE
INTRAMUSCULAR | Status: AC
  Filled 2019-09-18: qty 5

## 2019-09-18 MED ORDER — DIPHENHYDRAMINE HCL 12.5 MG/5ML PO ELIX: 12.5000 mg | ORAL_SOLUTION | ORAL | Status: DC | PRN

## 2019-09-18 MED ORDER — FENTANYL CITRATE (PF) 100 MCG/2ML IJ SOLN
INTRAMUSCULAR | Status: DC | PRN
  Administered 2019-09-18 (×2): 50 ug via INTRAVENOUS

## 2019-09-18 MED ORDER — ASPIRIN 81 MG PO CHEW
81.0000 mg | CHEWABLE_TABLET | Freq: Two times a day (BID) | ORAL | Status: DC
  Administered 2019-09-18 – 2019-09-20 (×4): 81 mg via ORAL
  Filled 2019-09-18 (×4): qty 1

## 2019-09-18 MED ORDER — POVIDONE-IODINE 10 % EX SWAB
2.0000 "application " | Freq: Once | CUTANEOUS | Status: AC
  Administered 2019-09-18: 2 via TOPICAL

## 2019-09-18 MED ORDER — MIDAZOLAM HCL 5 MG/5ML IJ SOLN
INTRAMUSCULAR | Status: DC | PRN
  Administered 2019-09-18: 2 mg via INTRAVENOUS

## 2019-09-18 MED ORDER — 0.9 % SODIUM CHLORIDE (POUR BTL) OPTIME
TOPICAL | Status: DC | PRN
  Administered 2019-09-18: 1000 mL

## 2019-09-18 MED ORDER — PROMETHAZINE HCL 25 MG/ML IJ SOLN: 6.2500 mg | INTRAMUSCULAR | Status: DC | PRN

## 2019-09-18 MED ORDER — LIDOCAINE 2% (20 MG/ML) 5 ML SYRINGE
INTRAMUSCULAR | Status: DC | PRN
  Administered 2019-09-18: 60 mg via INTRAVENOUS

## 2019-09-18 MED ORDER — SODIUM CHLORIDE 0.9 % IV SOLN: INTRAVENOUS | Status: DC

## 2019-09-18 MED ORDER — BUPIVACAINE IN DEXTROSE 0.75-8.25 % IT SOLN
INTRATHECAL | Status: DC | PRN
  Administered 2019-09-18: 2 mL via INTRATHECAL

## 2019-09-18 MED ORDER — PROPOFOL 10 MG/ML IV BOLUS
INTRAVENOUS | Status: DC | PRN
  Administered 2019-09-18: 30 mg via INTRAVENOUS

## 2019-09-18 MED ORDER — HYDROMORPHONE HCL 1 MG/ML IJ SOLN: 0.2500 mg | INTRAMUSCULAR | Status: DC | PRN

## 2019-09-18 MED ORDER — OXYCODONE HCL 5 MG PO TABS: 5.0000 mg | ORAL_TABLET | Freq: Once | ORAL | Status: DC | PRN

## 2019-09-18 MED ORDER — GABAPENTIN 100 MG PO CAPS
100.0000 mg | ORAL_CAPSULE | Freq: Three times a day (TID) | ORAL | Status: DC
  Administered 2019-09-18 – 2019-09-19 (×3): 100 mg via ORAL
  Filled 2019-09-18 (×3): qty 1

## 2019-09-18 MED ORDER — STERILE WATER FOR IRRIGATION IR SOLN
Status: DC | PRN
  Administered 2019-09-18: 2000 mL

## 2019-09-18 MED ORDER — ONDANSETRON HCL 4 MG PO TABS: 4.0000 mg | ORAL_TABLET | Freq: Four times a day (QID) | ORAL | Status: DC | PRN

## 2019-09-18 SURGICAL SUPPLY — 40 items
BAG ZIPLOCK 12X15 (MISCELLANEOUS) IMPLANT
BALL HIP ARTICU EZE 36 8.5 (Hips) ×1 IMPLANT
BENZOIN TINCTURE PRP APPL 2/3 (GAUZE/BANDAGES/DRESSINGS) IMPLANT
BLADE SAW SGTL 18X1.27X75 (BLADE) ×2 IMPLANT
COVER PERINEAL POST (MISCELLANEOUS) ×2 IMPLANT
COVER SURGICAL LIGHT HANDLE (MISCELLANEOUS) ×2 IMPLANT
COVER WAND RF STERILE (DRAPES) ×2 IMPLANT
CUP ACET PINNACLE SECTR 60MM (Hips) ×1 IMPLANT
DRAPE STERI IOBAN 125X83 (DRAPES) ×2 IMPLANT
DRAPE U-SHAPE 47X51 STRL (DRAPES) ×4 IMPLANT
DRSG AQUACEL AG ADV 3.5X10 (GAUZE/BANDAGES/DRESSINGS) ×2 IMPLANT
DURAPREP 26ML APPLICATOR (WOUND CARE) ×2 IMPLANT
ELECT REM PT RETURN 15FT ADLT (MISCELLANEOUS) ×2 IMPLANT
GAUZE XEROFORM 1X8 LF (GAUZE/BANDAGES/DRESSINGS) ×2 IMPLANT
GLOVE BIO SURGEON STRL SZ7.5 (GLOVE) ×2 IMPLANT
GLOVE BIOGEL PI IND STRL 8 (GLOVE) ×2 IMPLANT
GLOVE BIOGEL PI INDICATOR 8 (GLOVE) ×2
GLOVE ECLIPSE 8.0 STRL XLNG CF (GLOVE) ×2 IMPLANT
GOWN STRL REUS W/TWL XL LVL3 (GOWN DISPOSABLE) ×4 IMPLANT
HANDPIECE INTERPULSE COAX TIP (DISPOSABLE) ×1
HIP BALL ARTICU EZE 36 8.5 (Hips) ×2 IMPLANT
HOLDER FOLEY CATH W/STRAP (MISCELLANEOUS) ×2 IMPLANT
KIT TURNOVER KIT A (KITS) IMPLANT
LINER NEUTRAL 58X36MM PLUS4 ×2 IMPLANT
PACK ANTERIOR HIP CUSTOM (KITS) ×2 IMPLANT
PENCIL SMOKE EVACUATOR (MISCELLANEOUS) ×2 IMPLANT
PINNSECTOR W/GRIP ACE CUP 60MM (Hips) ×2 IMPLANT
SET HNDPC FAN SPRY TIP SCT (DISPOSABLE) ×1 IMPLANT
STAPLER VISISTAT 35W (STAPLE) ×2 IMPLANT
STEM FEM ACTIS HIGH SZ7 (Stem) ×2 IMPLANT
STRIP CLOSURE SKIN 1/2X4 (GAUZE/BANDAGES/DRESSINGS) IMPLANT
SUT ETHIBOND NAB CT1 #1 30IN (SUTURE) ×2 IMPLANT
SUT ETHILON 2 0 PS N (SUTURE) IMPLANT
SUT MNCRL AB 4-0 PS2 18 (SUTURE) IMPLANT
SUT VIC AB 0 CT1 36 (SUTURE) ×2 IMPLANT
SUT VIC AB 1 CT1 36 (SUTURE) ×2 IMPLANT
SUT VIC AB 2-0 CT1 27 (SUTURE) ×2
SUT VIC AB 2-0 CT1 TAPERPNT 27 (SUTURE) ×2 IMPLANT
TRAY FOLEY MTR SLVR 16FR STAT (SET/KITS/TRAYS/PACK) ×2 IMPLANT
YANKAUER SUCT BULB TIP 10FT TU (MISCELLANEOUS) ×2 IMPLANT

## 2019-09-18 NOTE — Anesthesia Procedure Notes (Signed)
Spinal  Patient location during procedure: OR Start time: 09/18/2019 8:42 AM End time: 09/18/2019 8:49 AM Staffing Performed: resident/CRNA  Anesthesiologist: Jairo Ben, MD Resident/CRNA: Jhonnie Garner, CRNA Preanesthetic Checklist Completed: patient identified, IV checked, risks and benefits discussed, surgical consent, monitors and equipment checked, pre-op evaluation and timeout performed Spinal Block Patient position: sitting Prep: DuraPrep Patient monitoring: heart rate, cardiac monitor, continuous pulse ox and blood pressure Approach: midline Location: L3-4 Injection technique: single-shot Needle Needle type: Pencan  Needle gauge: 24 G Needle length: 9 cm Assessment Sensory level: T4 Additional Notes Clear CSF, no paresthesia, patient tolerated well.

## 2019-09-18 NOTE — Anesthesia Postprocedure Evaluation (Signed)
Anesthesia Post Note  Patient: [place] Huber  Procedure(s) Performed: RIGHT TOTAL HIP ARTHROPLASTY ANTERIOR APPROACH (Right Hip)     Patient location during evaluation: PACU Anesthesia Type: Spinal Level of consciousness: awake and alert, oriented and patient cooperative Pain management: pain level controlled Vital Signs Assessment: post-procedure vital signs reviewed and stable Respiratory status: spontaneous breathing, nonlabored ventilation and respiratory function stable Cardiovascular status: blood pressure returned to baseline and stable Postop Assessment: no apparent nausea or vomiting, spinal receding and patient able to bend at knees Anesthetic complications: no    Last Vitals:  Vitals:   09/18/19 1408 09/18/19 1506  BP: 113/77 110/76  Pulse: 70 71  Resp: 16 16  Temp: 36.6 C   SpO2: 96% 97%    Last Pain:  Vitals:   09/18/19 1408  TempSrc: Oral  PainSc:                  [name],E. Andrianna Manalang

## 2019-09-18 NOTE — Plan of Care (Signed)
Plan of care 

## 2019-09-18 NOTE — Brief Op Note (Signed)
09/18/2019  10:00 AM  PATIENT:  Cody Huber  59 y.o. male  PRE-OPERATIVE DIAGNOSIS:  osteoarthritis right hip  POST-OPERATIVE DIAGNOSIS:  osteoarthritis right hip  PROCEDURE:  Procedure(s): RIGHT TOTAL HIP ARTHROPLASTY ANTERIOR APPROACH (Right)  SURGEON:  Surgeon(s) and Role:    Kathryne Hitch, MD - Primary  PHYSICIAN ASSISTANT:  Rexene Edison, PA-C  ANESTHESIA:   spinal  EBL:  150 mL   COUNTS:  YES  DICTATION: .Other Dictation: Dictation Number [id number]  PLAN OF CARE: Admit to inpatient   PATIENT DISPOSITION:  PACU - hemodynamically stable.   Delay start of Pharmacological VTE agent (>24hrs) due to surgical blood loss or risk of bleeding: no

## 2019-09-18 NOTE — Op Note (Signed)
NAME: TERRIS, GERMANO MEDICAL RECORD ZO:[id number] ACCOUNT [id number] DATE OF BIRTH:05-09-1961 FACILITY: [facility] LOCATION: [facility] PHYSICIAN:Dequarius Jeffries Aretha Parrot, MD  OPERATIVE REPORT  DATE OF PROCEDURE:  09/18/2019  PREOPERATIVE DIAGNOSIS:  Primary osteoarthritis and degenerative joint disease, right hip.  POSTOPERATIVE DIAGNOSIS:  Primary osteoarthritis and degenerative joint disease, right hip.  PROCEDURE:  Right total hip arthroplasty through direct anterior approach.  IMPLANTS:  DePuy Sector Gription acetabular component size 60, size 36+4 neutral polyethylene liner, size 7 Actis femoral component with high offset, size 36+8.5 metal hip ball.  SURGEON:  Vanita Panda. Magnus Ivan, MD  ASSISTANT:  Richardean Canal, PA-C.  ANESTHESIA:  Spinal.  ANTIBIOTICS:  Two g IV Ancef.  ESTIMATED BLOOD LOSS:  150 mL.  COMPLICATIONS:  None.  INDICATIONS:  The patient is a 59 year old gentleman with debilitating arthritis involving his right hip.  It has been well documented with clinical exam and x-rays.  At this point, his pain is 10/10 and it is daily.  His right hip osteoarthritis is  detrimentally affecting his mobility, his quality of life and his activities of daily living to the point he does wish to proceed with a total hip arthroplasty.  He has tried and failed all forms of conservative treatment.  At this point, he understands  with this type of surgery, there is at risk of acute blood loss anemia, nerve or vessel injury, fracture, infection, dislocation, DVT and implant failure.  We have talked about the goals of decreased pain, improved mobility and overall improved quality  of life.  DESCRIPTION OF PROCEDURE:  After informed consent was obtained and appropriate right hip was marked.  He was brought to the operating room and sat up on a stretcher where spinal anesthesia was then obtained.  He was then laid in the supine position on a  stretcher.  Foley catheter was placed and  traction boots were placed on both his feet.   Next, he was placed supine on the Hana fracture table, the perineal post in place and both legs in line skeletal traction device and no traction applied.  His right  operative hip was prepped and draped with DuraPrep and sterile drapes.  A timeout was called and he was identified as correct patient, the correct right hip.  I then made an incision just inferior and posterior to the anterior superior iliac spine and  carried this obliquely down the leg.  I dissected down to the tensor fascia lata muscle.  Tensor fascia was then divided longitudinally to proceed with direct anterior approach to the hip.  We identified and cauterized circumflex vessels and identified  the hip capsule, opened the hip capsule in an L-type format, finding a moderate joint effusion and significant deformity of the right hip with periarticular osteophytes as well.  Cobra retractors were placed around the medial and lateral femoral neck and  we made our femoral neck cut with an oscillating saw and completed this with an osteotome.  We placed a corkscrew guide in the femoral head and removed the femoral head in its entirety and found a wide area devoid of cartilage.  I then placed a bent  Hohmann over the medial acetabular rim and removed remnants of the acetabular labrum and other debris.  I then began reaming under direct visualization from a size small reamer, like a 44, but we then jumped a lot of increments and eventually ended up  with a size 59 reamer.  This was placed under direct visualization as well as direct fluoroscopy, so  we could obtain our depth of reaming, our inclination and anteversion.  We then placed the real DePuy Sector Gription acetabular component size 60 and a  36+4 neutral polyethylene liner.  Attention was then turned to the femur.  With the leg externally rotated to 120 degrees, extended and adducted, we were able to place a Mueller retractor medially and a  Hohmann retractor above the greater trochanter.  We  released the lateral joint capsule and used a box-cutting osteotome to enter the femoral canal and a rongeur to lateralize.  We then began broaching using the Actis broaching system from a size 0 up to a size 7.  With a size 7 in place, we trialed a  standard offset femoral neck and a 36+1.5 hip ball, reduced this in the acetabulum.  Right away, we could tell we needed more leg length and offset for range of motion and stability.  We dislocated the hip and removed the trial components.  We went with  the real high offset femoral component, which was size 7 Actis and we went a 36+8.5 metal hip ball.  We reduced this into the acetabulum and I was pleased with range of motion and stability, leg length and offset measured mechanically and  radiographically.  We then irrigated the tissue with normal saline solution using pulsatile lavage.  We closed the joint capsule with interrupted #1 Ethibond suture, followed by running #1 Vicryl to close the tensor fascia, 0 Vicryl was used to close  deep tissue, 2-0 Vicryl was used to close subcutaneous tissue and interrupted staples were used to reapproximate the skin.  Xeroform and Aquacel dressing was applied.  He was taken off the Hana table and taken to the recovery room in stable condition.   All final counts were correct.  There were no complications noted.  Of note, Benita Stabile, PA-C, assisted during the entire case.  His assistance was crucial for facilitating all aspects of this case.  VN/NUANCE  D:09/18/2019 T:09/18/2019 JOB:[id number]

## 2019-09-18 NOTE — Evaluation (Signed)
Physical Therapy Evaluation Patient Details Name: Cody Huber MRN: [id number] DOB: 1961-03-07 Today's Date: 09/18/2019   History of Present Illness  Patient is 59 y.o. male s/p Rt THA anterior approach on 09/18/19 with PMH significant for inguinal hernia, HLD, eczema.  Clinical Impression  Cody Huber is a 59 y.o. male POD 0 s/p Rt THA. Patient reports independence with mobility at baseline. Patient is now limited by functional impairments (see PT problem list below) and requires min assist for transfers and gait with RW. Patient was able to ambulate ~35 feet with RW and min assist. Patient instructed in exercise to facilitate LE strength and circulation. Patient will benefit from continued skilled PT interventions to address impairments and progress towards PLOF. Acute PT will follow to progress mobility and stair training in preparation for safe discharge home.     Follow Up Recommendations Follow surgeon's recommendation for DC plan and follow-up therapies;Outpatient PT    Equipment Recommendations  Rolling walker with 5" wheels    Recommendations for Other Services       Precautions / Restrictions Precautions Precautions: Fall Restrictions Weight Bearing Restrictions: No      Mobility  Bed Mobility Overal bed mobility: Needs Assistance Bed Mobility: Supine to Sit     Supine to sit: Min assist;HOB elevated     General bed mobility comments: cues for use of bed rail and assist for Rt E mobility.  Transfers Overall transfer level: Needs assistance Equipment used: Rolling walker (2 wheeled) Transfers: Sit to/from Stand Sit to Stand: Min assist;From elevated surface         General transfer comment: cues for technique with RW, assist for power up and to steady with rising.  Ambulation/Gait Ambulation/Gait assistance: Min assist;Min guard Gait Distance (Feet): 35 Feet Assistive device: Rolling walker (2 wheeled) Gait Pattern/deviations: Step-to  pattern;Decreased stride length;Decreased stance time - right;Decreased weight shift to right Gait velocity: decreased   General Gait Details: cues for safe step pattern and proximity to RW. Pt steady with no overt LOB noted, assist required for walker positioning.  Stairs       Wheelchair Mobility    Modified Rankin (Stroke Patients Only)       Balance Overall balance assessment: Needs assistance Sitting-balance support: Feet supported Sitting balance-Leahy Scale: Good     Standing balance support: During functional activity;Bilateral upper extremity supported Standing balance-Leahy Scale: Poor            Pertinent Vitals/Pain Pain Assessment: 0-10 Pain Score: 3  Pain Location: Rt hip Pain Descriptors / Indicators: Aching;Burning Pain Intervention(s): Limited activity within patient's tolerance;Monitored during session;Repositioned;Ice applied    Home Living Family/patient expects to be discharged to:: Private residence Living Arrangements: Spouse/significant other Available Help at Discharge: Family Type of Home: House Home Access: Stairs to enter Entrance Stairs-Rails: Chemical engineer of Steps: 5 at front Home Layout: One level Home Equipment: Shower seat - built in;Cane - single point      Prior Function Level of Independence: Independent with assistive device(s);Independent         Comments: uses SPC in morning but able to amulate with no device after hip becomes less stiff     Hand Dominance   Dominant Hand: Right    Extremity/Trunk Assessment   Upper Extremity Assessment Upper Extremity Assessment: Overall WFL for tasks assessed    Lower Extremity Assessment Lower Extremity Assessment: Overall WFL for tasks assessed    Cervical / Trunk Assessment Cervical / Trunk Assessment: Normal  Communication  Communication: No difficulties  Cognition Arousal/Alertness: Awake/alert Behavior During Therapy: WFL for tasks  assessed/performed Overall Cognitive Status: Within Functional Limits for tasks assessed    General Comments      Exercises Total Joint Exercises Ankle Circles/Pumps: AROM;Seated;20 reps;Both Long Arc Quad: AROM;Right;10 reps;Seated   Assessment/Plan    PT Assessment Patient needs continued PT services  PT Problem List Decreased strength;Decreased range of motion;Decreased activity tolerance;Decreased mobility;Decreased balance;Decreased knowledge of use of DME;Decreased knowledge of precautions       PT Treatment Interventions DME instruction;Gait training;Stair training;Functional mobility training;Therapeutic activities;Therapeutic exercise;Balance training;Patient/family education    PT Goals (Current goals can be found in the Care Plan section)  Acute Rehab PT Goals Patient Stated Goal: to get walking without cane and go on vacation at end of June PT Goal Formulation: With patient Time For Goal Achievement: 09/25/19 Potential to Achieve Goals: Good    Frequency 7X/week    AM-PAC PT "6 Clicks" Mobility  Outcome Measure Help needed turning from your back to your side while in a flat bed without using bedrails?: A Little Help needed moving from lying on your back to sitting on the side of a flat bed without using bedrails?: A Little Help needed moving to and from a bed to a chair (including a wheelchair)?: A Little Help needed standing up from a chair using your arms (e.g., wheelchair or bedside chair)?: A Little Help needed to walk in hospital room?: A Little Help needed climbing 3-5 steps with a railing? : A Little 6 Click Score: 18    End of Session Equipment Utilized During Treatment: Gait belt Activity Tolerance: Patient tolerated treatment well Patient left: in chair;with call bell/phone within reach;with chair alarm set Nurse Communication: Mobility status PT Visit Diagnosis: Muscle weakness (generalized) (M62.81);Difficulty in walking, not elsewhere classified  (R26.2)    Time: 7124-5809 PT Time Calculation (min) (ACUTE ONLY): 31 min   Charges:   PT Evaluation $PT Eval Low Complexity: 1 Low PT Treatments $Gait Training: 8-22 mins       Wynn Maudlin, DPT Physical Therapist with [facility] [phone]  09/18/2019 5:41 PM

## 2019-09-18 NOTE — TOC Transition Note (Signed)
Transition of Care [facility]) - CM/SW Discharge Note   Patient Details  Name: Cody Huber MRN: [id number] Date of Birth: 09-Oct-1960  Transition of Care [facility]) CM/SW Contact:  Lennart Pall, LCSW Phone Number: 09/18/2019, 4:21 PM   Clinical Narrative:   Met with pt to review d/c needs.  Pt aware he is already referred for HHPT via [facility].  MD orders for rolling walker and 3n1.  Pt with no agency preferred. Placed DME order with [employer].  Likely d/c tomorrow.    Final next level of care: Home w Home Health Services Barriers to Discharge: No Barriers Identified   Patient Goals and CMS Choice Patient states their goals for this hospitalization and ongoing recovery are:: hope to go home tomorrow      Discharge Placement                       Discharge Plan and Services                DME Arranged: 3-N-1, Walker rolling DME Agency: [employer] Date DME Agency Contacted: 09/18/19 Time DME Agency Contacted: [phone] Representative spoke with at DME Agency: [place]: PT [place]: [facility] (now [facility]) Date [place]: (pre-arranged by MD office PTA)      Social Determinants of Health (SDOH) Interventions     Readmission Risk Interventions No flowsheet data found.

## 2019-09-18 NOTE — H&P (Signed)
TOTAL HIP ADMISSION H&P  Patient is admitted for right total hip arthroplasty.  Subjective:  Chief Complaint: right hip pain  HPI: Cody Huber, 59 y.o. male, has a history of pain and functional disability in the right hip(s) due to arthritis and patient has failed non-surgical conservative treatments for greater than 12 weeks to include NSAID's and/or analgesics, corticosteriod injections and flexibility and strengthening excercises.  Onset of symptoms was gradual starting 3 years ago with gradually worsening course since that time.The patient noted no past surgery on the right hip(s).  Patient currently rates pain in the right hip at 10 out of 10 with activity. Patient has night pain, worsening of pain with activity and weight bearing, pain that interfers with activities of daily living and pain with passive range of motion. Patient has evidence of subchondral cysts, subchondral sclerosis, periarticular osteophytes and joint space narrowing by imaging studies. This condition presents safety issues increasing the risk of falls.  There is no current active infection.  Patient Active Problem List   Diagnosis Date Noted  . Unilateral primary osteoarthritis, right hip 05/26/2019  . Abnormal glucose 06/19/2016  . Family history of aneurysm 06/09/2014  . AR (allergic rhinitis) 05/20/2012  . Obturator hernia, right 08/13/2011  . Bilateral inguinal hernia (R>>L) 07/10/2011  . Diastasis recti 07/10/2011  . Hyperlipemia 10/21/2008   Past Medical History:  Diagnosis Date  . Eczema   . Elevated cholesterol   . Hyperlipidemia   . Nasal congestion   . Right inguinal hernia     Past Surgical History:  Procedure Laterality Date  . HEMORRHOID SURGERY  2006 - approximate  . INGUINAL HERNIA REPAIR  07/24/2011   Procedure: LAPAROSCOPIC BILATERAL INGUINAL HERNIA REPAIR;  Surgeon: Ardeth Sportsman, MD;  Location: [facility];  Service: General;  Laterality: Bilateral;    Current Facility-Administered  Medications  Medication Dose Route Frequency Provider Last Rate Last Admin  . ceFAZolin (ANCEF) IVPB 2g/100 mL premix  2 g Intravenous On Call to OR Kirtland Bouchard, PA-C      . povidone-iodine 10 % swab 2 application  2 application Topical Once Kirtland Bouchard, PA-C      . tranexamic acid (CYKLOKAPRON) IVPB 1,000 mg  1,000 mg Intravenous To OR Kirtland Bouchard, PA-C       No Known Allergies  Social History   Tobacco Use  . Smoking status: Former Smoker    Quit date: 05/21/1984    Years since quitting: 35.3  . Smokeless tobacco: Never Used  Substance Use Topics  . Alcohol use: Yes    Alcohol/week: 2.0 standard drinks    Types: 2 Standard drinks or equivalent per week    Comment: 5-6 beers per week    Family History  Problem Relation Age of Onset  . Diabetes Father   . Melanoma Father   . Hyperlipidemia Father   . Heart disease Father 8  . Hypertension Mother   . Aneurysm Mother 42     Review of Systems  All other systems reviewed and are negative.   Objective:  Physical Exam  Constitutional: He is oriented to person, place, and time. He appears well-developed and well-nourished.  HENT:  Head: Normocephalic and atraumatic.  Eyes: Pupils are equal, round, and reactive to light. EOM are normal.  Cardiovascular: Normal rate.  Respiratory: Effort normal.  GI: Soft.  Musculoskeletal:     Cervical back: Normal range of motion and neck supple.     Right hip: Tenderness and bony tenderness  present. Decreased range of motion. Decreased strength.  Neurological: He is alert and oriented to person, place, and time.  Skin: Skin is warm and dry.  Psychiatric: He has a normal mood and affect.    Vital signs in last 24 hours: Temp:  [97.8 F (36.6 C)] 97.8 F (36.6 C) (04/30 0641) Pulse Rate:  [73] 73 (04/30 0641) Resp:  [18] 18 (04/30 0641) BP: (140)/(87) 140/87 (04/30 0641) SpO2:  [100 %] 100 % (04/30 0641)  Labs:   Estimated body mass index is 31.66 kg/m as  calculated from the following:   Height as of 09/15/19: 5\' 11"  (1.803 m).   Weight as of 09/15/19: 103 kg.   Imaging Review Plain radiographs demonstrate severe degenerative joint disease of the right hip(s). The bone quality appears to be excellent for age and reported activity level.      Assessment/Plan:  End stage arthritis, right hip(s)  The patient history, physical examination, clinical judgement of the provider and imaging studies are consistent with end stage degenerative joint disease of the right hip(s) and total hip arthroplasty is deemed medically necessary. The treatment options including medical management, injection therapy, arthroscopy and arthroplasty were discussed at length. The risks and benefits of total hip arthroplasty were presented and reviewed. The risks due to aseptic loosening, infection, stiffness, dislocation/subluxation,  thromboembolic complications and other imponderables were discussed.  The patient acknowledged the explanation, agreed to proceed with the plan and consent was signed. Patient is being admitted for inpatient treatment for surgery, pain control, PT, OT, prophylactic antibiotics, VTE prophylaxis, progressive ambulation and ADL's and discharge planning.The patient is planning to be discharged home with home health services    Patient's anticipated LOS is less than 2 midnights, meeting these requirements: - Younger than 54 - Lives within 1 hour of care - Has a competent adult at home to recover with post-op recover - NO history of  - Chronic pain requiring opiods  - Diabetes  - Coronary Artery Disease  - Heart failure  - Heart attack  - Stroke  - DVT/VTE  - Cardiac arrhythmia  - Respiratory Failure/COPD  - Renal failure  - Anemia  - Advanced Liver disease

## 2019-09-18 NOTE — Transfer of Care (Signed)
Immediate Anesthesia Transfer of Care Note  Patient: Cody Huber  Procedure(s) Performed: RIGHT TOTAL HIP ARTHROPLASTY ANTERIOR APPROACH (Right Hip)  Patient Location: PACU  Anesthesia Type:Spinal  Level of Consciousness: sedated  Airway & Oxygen Therapy: Patient Spontanous Breathing and Patient connected to face mask oxygen  Post-op Assessment: Report given to RN and Post -op Vital signs reviewed and stable  Post vital signs: Reviewed and stable  Last Vitals:  Vitals Value Taken Time  BP 93/64 09/18/19 1012  Temp    Pulse 85 09/18/19 1013  Resp 16 09/18/19 1013  SpO2 98 % 09/18/19 1013  Vitals shown include unvalidated device data.  Last Pain:  Vitals:   09/18/19 0650  TempSrc:   PainSc: 5       Patients Stated Pain Goal: 4 (09/18/19 0650)  Complications: No apparent anesthesia complications

## 2019-09-19 ENCOUNTER — Encounter (HOSPITAL_COMMUNITY): Payer: Self-pay | Admitting: Orthopaedic Surgery

## 2019-09-19 DIAGNOSIS — Z6831 Body mass index (BMI) 31.0-31.9, adult: Secondary | ICD-10-CM | POA: Diagnosis not present

## 2019-09-19 DIAGNOSIS — M1611 Unilateral primary osteoarthritis, right hip: Secondary | ICD-10-CM | POA: Diagnosis present

## 2019-09-19 DIAGNOSIS — E669 Obesity, unspecified: Secondary | ICD-10-CM | POA: Diagnosis present

## 2019-09-19 DIAGNOSIS — Z20822 Contact with and (suspected) exposure to covid-19: Secondary | ICD-10-CM | POA: Diagnosis present

## 2019-09-19 DIAGNOSIS — E785 Hyperlipidemia, unspecified: Secondary | ICD-10-CM | POA: Diagnosis present

## 2019-09-19 DIAGNOSIS — E7800 Pure hypercholesterolemia, unspecified: Secondary | ICD-10-CM | POA: Diagnosis present

## 2019-09-19 DIAGNOSIS — Z87891 Personal history of nicotine dependence: Secondary | ICD-10-CM | POA: Diagnosis not present

## 2019-09-19 LAB — BASIC METABOLIC PANEL
Anion gap: 6 (ref 5–15)
BUN: 13 mg/dL (ref 6–20)
CO2: 25 mmol/L (ref 22–32)
Calcium: 8.6 mg/dL — ABNORMAL LOW (ref 8.9–10.3)
Chloride: 111 mmol/L (ref 98–111)
Creatinine, Ser: 0.91 mg/dL (ref 0.61–1.24)
GFR calc Af Amer: >60 mL/min (ref >60)
GFR calc non Af Amer: >60 mL/min (ref >60)
Glucose, Bld: 137 mg/dL — ABNORMAL HIGH (ref 70–99)
Potassium: 3.8 mmol/L (ref 3.5–5.1)
Sodium: 142 mmol/L (ref 135–145)

## 2019-09-19 LAB — CBC
HCT: 40.4 % (ref 39.0–52.0)
Hemoglobin: 13 g/dL (ref 13.0–17.0)
MCH: 29.2 pg (ref 26.0–34.0)
MCHC: 32.2 g/dL (ref 30.0–36.0)
MCV: 90.8 fL (ref 80.0–100.0)
Platelets: 168 10*3/uL (ref 150–400)
RBC: 4.45 MIL/uL (ref 4.22–5.81)
RDW: 13.2 % (ref 11.5–15.5)
WBC: 10.4 10*3/uL (ref 4.0–10.5)
nRBC: 0 % (ref 0.0–0.2)

## 2019-09-19 MED ORDER — TAMSULOSIN HCL 0.4 MG PO CAPS
0.4000 mg | ORAL_CAPSULE | Freq: Every day | ORAL | Status: DC
  Administered 2019-09-19: 0.4 mg via ORAL
  Filled 2019-09-19: qty 1

## 2019-09-19 MED ORDER — SODIUM CHLORIDE 0.9 % IV BOLUS
300.0000 mL | Freq: Once | INTRAVENOUS | Status: AC
  Administered 2019-09-19: 300 mL via INTRAVENOUS

## 2019-09-19 MED ORDER — HYDROCODONE-ACETAMINOPHEN 5-325 MG PO TABS
1.0000 | ORAL_TABLET | Freq: Four times a day (QID) | ORAL | Status: DC | PRN
  Administered 2019-09-19: 1 via ORAL
  Filled 2019-09-19: qty 1

## 2019-09-19 MED ORDER — HYDROCODONE-ACETAMINOPHEN 5-325 MG PO TABS: 1.0000 | ORAL_TABLET | Freq: Four times a day (QID) | ORAL | 0 refills | Status: DC | PRN

## 2019-09-19 MED ORDER — METHOCARBAMOL 500 MG PO TABS: 500.0000 mg | ORAL_TABLET | Freq: Four times a day (QID) | ORAL | 0 refills | Status: DC | PRN

## 2019-09-19 MED ORDER — ASPIRIN 81 MG PO CHEW: 81.0000 mg | CHEWABLE_TABLET | Freq: Two times a day (BID) | ORAL | 0 refills | Status: DC

## 2019-09-19 NOTE — Plan of Care (Signed)
Plan of care reviewed and discussed with the patient. 

## 2019-09-19 NOTE — Progress Notes (Signed)
Physical Therapy Treatment Patient Details Name: Cody Huber MRN: [id number] DOB: 1960-09-27 Today's Date: 09/19/2019    History of Present Illness Patient is 59 y.o. male s/p Rt THA anterior approach on 09/18/19 with PMH significant for inguinal hernia, HLD, eczema.    PT Comments    POD # 1 pm session Pt feeling better.  Assisted with amb. General transfer comment: 50% VC's on proper hand placement and increased time needed.  General Gait Details: pt tolerated an increased distance.  Step to gait.  Slow but steady.  No c/o dizziness, just fatigue/effort.  Increased c/o pain from 2/10 at rest to 6/10 with activity.  Assisted back to bed and performed some TE's followed by ICE. Pt plans to D/C to home tomorrow.     Follow Up Recommendations  Follow surgeon's recommendation for DC plan and follow-up therapies;Home health PT     Equipment Recommendations  Rolling walker with 5" wheels;3in1 (PT)    Recommendations for Other Services       Precautions / Restrictions Precautions Precautions: Fall Restrictions Weight Bearing Restrictions: No Other Position/Activity Restrictions: WBAT    Mobility  Bed Mobility Overal bed mobility: Needs Assistance Bed Mobility: Sit to Supine     Supine to sit: Min assist;HOB elevated Sit to supine: Mod assist   General bed mobility comments: assisted back to bed and required increased assist to support R LE up on to bed and increased time to position to comfort.  Transfers Overall transfer level: Needs assistance Equipment used: Rolling walker (2 wheeled) Transfers: Sit to/from Stand Sit to Stand: Min assist;From elevated surface         General transfer comment: 50% VC's on proper hand placement and increased time needed  Ambulation/Gait Ambulation/Gait assistance: Min guard;Min assist Gait Distance (Feet): 35 Feet Assistive device: Rolling walker (2 wheeled) Gait Pattern/deviations: Step-to pattern;Decreased stride  length;Decreased stance time - right;Decreased weight shift to right Gait velocity: decreased   General Gait Details: pt tolerated an increased distance.  Step to gait.  Slow but steady.  No c/o dizziness, just fatigue/effort.  Increased c/o pain from 2/10 at rest to 6/10 with activity.   Stairs             Wheelchair Mobility    Modified Rankin (Stroke Patients Only)       Balance                                            Cognition Arousal/Alertness: Awake/alert Behavior During Therapy: WFL for tasks assessed/performed Overall Cognitive Status: Within Functional Limits for tasks assessed                                        Exercises   Total Hip Replacement TE's following HEP Handout 10 reps ankle pumps 05 reps knee presses 05 reps heel slides 05 reps SAQ's 05 reps ABD Instructed how to use a belt loop to assist  Followed by ICE    General Comments        Pertinent Vitals/Pain Pain Assessment: 0-10 Pain Score: 3  Pain Location: Rt hip Pain Descriptors / Indicators: Aching;Burning;Tender;Operative site guarding Pain Intervention(s): Monitored during session;Premedicated before session;Repositioned;Ice applied    Home Living  Prior Function            PT Goals (current goals can now be found in the care plan section) Progress towards PT goals: Progressing toward goals    Frequency    7X/week      PT Plan Current plan remains appropriate    Co-evaluation              AM-PAC PT "6 Clicks" Mobility   Outcome Measure  Help needed turning from your back to your side while in a flat bed without using bedrails?: A Little Help needed moving from lying on your back to sitting on the side of a flat bed without using bedrails?: A Little Help needed moving to and from a bed to a chair (including a wheelchair)?: A Little Help needed standing up from a chair using your arms (e.g.,  wheelchair or bedside chair)?: A Little Help needed to walk in hospital room?: A Little Help needed climbing 3-5 steps with a railing? : A Little 6 Click Score: 18    End of Session Equipment Utilized During Treatment: Gait belt Activity Tolerance: Patient limited by fatigue;Other (comment)(dizziness) Patient left: in bed Nurse Communication: Mobility status PT Visit Diagnosis: Muscle weakness (generalized) (M62.81);Difficulty in walking, not elsewhere classified (R26.2)     Time: 4967-5916 PT Time Calculation (min) (ACUTE ONLY): 25 min  Charges:  $Gait Training: 8-22 mins $Therapeutic Exercise: 8-22 mins                     Rica Koyanagi  PTA Acute  Rehabilitation Services Pager      [phone] Office      [phone]

## 2019-09-19 NOTE — Progress Notes (Signed)
Physical Therapy Treatment Patient Details Name: Cody Huber MRN: [id number] DOB: [birth year] Today's Date: 09/19/2019    History of Present Illness Patient is 59 y.o. male s/p Rt THA anterior approach on 09/18/19 with PMH significant for inguinal hernia, HLD, eczema.    PT Comments    POD # 1 am session Earlier pt had a syncope episode with nursing.  Completed bolus.  While monitoring vitals assisted to EOB.  General bed mobility comments: demonstarted and instructed how to use a belt to self assist LE.   Supine       BP 114/76   HR 73 EOB           BP 127/81   HR 89 Standing     BP 115/75   HR 91 Pt did amb only 10 feet when he stated "I need to sit ".  Pt describes head as "swimmy".  Recliner brought to pt and rolled back to his room.  Symptoms decreased with rest.  Then performed some TE's following HEP handout.  Instructed on proper tech, freq as well as use of ICE.   Pt plans to D/C to home Sunday.     Follow Up Recommendations  Follow surgeon's recommendation for DC plan and follow-up therapies;Home health PT     Equipment Recommendations  Rolling walker with 5" wheels;3in1 (PT)    Recommendations for Other Services       Precautions / Restrictions Precautions Precautions: Fall Restrictions Weight Bearing Restrictions: No Other Position/Activity Restrictions: WBAT    Mobility  Bed Mobility Overal bed mobility: Needs Assistance Bed Mobility: Supine to Sit     Supine to sit: Min assist;HOB elevated     General bed mobility comments: demonstarted and instructed how to use a belt to self assist LE  Transfers Overall transfer level: Needs assistance Equipment used: Rolling walker (2 wheeled) Transfers: Sit to/from Stand Sit to Stand: Min assist;From elevated surface         General transfer comment: 50% VC's on proper hand placement and increased time needed  Ambulation/Gait Ambulation/Gait assistance: Min assist;+2 safety/equipment Gait Distance  (Feet): 10 Feet Assistive device: Rolling walker (2 wheeled) Gait Pattern/deviations: Step-to pattern;Decreased stride length;Decreased stance time - right;Decreased weight shift to right Gait velocity: decreased   General Gait Details: decreased amb distance due to increased c/o dizziness/swimmy headed.  BP 115/75   Stairs             Wheelchair Mobility    Modified Rankin (Stroke Patients Only)       Balance                                            Cognition Arousal/Alertness: Awake/alert Behavior During Therapy: WFL for tasks assessed/performed Overall Cognitive Status: Within Functional Limits for tasks assessed                                        Exercises   Total Hip Replacement TE's following HEP Handout 10 reps ankle pumps 05 reps knee presses 05 reps heel slides 05 reps SAQ's 05 reps ABD Instructed how to use a belt loop to assist  Followed by ICE    General Comments        Pertinent Vitals/Pain Pain Assessment: 0-10 Pain Score: 3  Pain  Location: Rt hip Pain Descriptors / Indicators: Aching;Burning;Tender;Operative site guarding Pain Intervention(s): Monitored during session;Premedicated before session;Repositioned;Ice applied    Home Living                      Prior Function            PT Goals (current goals can now be found in the care plan section) Progress towards PT goals: Progressing toward goals    Frequency    7X/week      PT Plan Current plan remains appropriate    Co-evaluation              AM-PAC PT "6 Clicks" Mobility   Outcome Measure  Help needed turning from your back to your side while in a flat bed without using bedrails?: A Little Help needed moving from lying on your back to sitting on the side of a flat bed without using bedrails?: A Little Help needed moving to and from a bed to a chair (including a wheelchair)?: A Little Help needed standing up from  a chair using your arms (e.g., wheelchair or bedside chair)?: A Little Help needed to walk in hospital room?: A Little Help needed climbing 3-5 steps with a railing? : A Little 6 Click Score: 18    End of Session Equipment Utilized During Treatment: Gait belt Activity Tolerance: Patient limited by fatigue;Other (comment)(dizziness) Patient left: in chair;with call bell/phone within reach;with chair alarm set Nurse Communication: Mobility status PT Visit Diagnosis: Muscle weakness (generalized) (M62.81);Difficulty in walking, not elsewhere classified (R26.2)     Time: 1110-1135 PT Time Calculation (min) (ACUTE ONLY): 25 min  Charges:  $Gait Training: 8-22 mins $Therapeutic Exercise: 8-22 mins                     Rica Koyanagi  PTA Acute  Rehabilitation Services Pager      [phone] Office      [phone]

## 2019-09-19 NOTE — Discharge Instructions (Signed)

## 2019-09-19 NOTE — Progress Notes (Signed)
Subjective: 1 Day Post-Op Procedure(s) (LRB): RIGHT TOTAL HIP ARTHROPLASTY ANTERIOR APPROACH (Right) Patient reports pain as moderate.  Did have a near syncopal episode this am.  Vitals now stable and he looks good overall.  Labs are also stable. He did get a fluid bolus and holding pain meds.  Objective: Vital signs in last 24 hours: Temp:  [97.6 F (36.4 C)-99.3 F (37.4 C)] 99.3 F (37.4 C) (05/01 0934) Pulse Rate:  [39-79] 73 (05/01 0934) Resp:  [11-20] 19 (05/01 0934) BP: (78-129)/(53-85) 118/80 (05/01 0934) SpO2:  [96 %-100 %] 98 % (05/01 0934) Weight:  [102 kg] 102 kg (04/30 1200)  Intake/Output from previous day: 04/30 0701 - 05/01 0700 In: 3144.5 [P.O.:480; I.V.:2364.5; IV Piggyback:300] Out: 2200 [Urine:2050; Blood:150] Intake/Output this shift: Total I/O In: 1046.4 [P.O.:720; I.V.:26.4; IV Piggyback:300] Out: -   Recent Labs    09/19/19 0259  HGB 13.0   Recent Labs    09/19/19 0259  WBC 10.4  RBC 4.45  HCT 40.4  PLT 168   Recent Labs    09/19/19 0259  NA 142  K 3.8  CL 111  CO2 25  BUN 13  CREATININE 0.91  GLUCOSE 137*  CALCIUM 8.6*   No results for input(s): LABPT, INR in the last 72 hours.  Sensation intact distally Intact pulses distally Dorsiflexion/Plantar flexion intact Incision: dressing C/D/I   Assessment/Plan: 1 Day Post-Op Procedure(s) (LRB): RIGHT TOTAL HIP ARTHROPLASTY ANTERIOR APPROACH (Right) Up with therapy Will stop oxycodone and change to hydrocodone. Will keep him here today and discharge tomorrow.     Kathryne Hitch 09/19/2019, 10:36 AM

## 2019-09-19 NOTE — Plan of Care (Signed)
  Problem: Activity: Goal: Risk for activity intolerance will decrease Outcome: Progressing   Problem: Coping: Goal: Level of anxiety will decrease Outcome: Progressing   Problem: Pain Managment: Goal: General experience of comfort will improve Outcome: Progressing   Problem: Education: Goal: Understanding of discharge needs will improve Outcome: Progressing   Problem: Clinical Measurements: Goal: Postoperative complications will be avoided or minimized Outcome: Progressing

## 2019-09-19 NOTE — Progress Notes (Signed)
Dr. Ophelia Charter notified of pt almost syncopal episode while attempting to simply move to chair. Rn found pt to to be pale and his lips were blue. Pt was also sweaty. Rn placed pt in [place] and restarted iv fluids at normal rate, was kvo, pt eating and drinking well. Dr. Ophelia Charter ordered NS bolus which was given. Pt reports feeling better overall and and reports no dizziness at this time. RN will continue to monitor.

## 2019-09-20 NOTE — Progress Notes (Signed)
   Subjective: 2 Days Post-Op Procedure(s) (LRB): RIGHT TOTAL HIP ARTHROPLASTY ANTERIOR APPROACH (Right) Patient reports pain as mild and moderate.    Objective: Vital signs in last 24 hours: Temp:  [98.3 F (36.8 C)-99 F (37.2 C)] 98.7 F (37.1 C) (05/02 4320) Pulse Rate:  [65-87] 87 (05/02 0613) Resp:  [17-18] 17 (05/01 2142) BP: (107-118)/(70-82) 112/80 (05/02 0613) SpO2:  [96 %-99 %] 96 % (05/02 0379)  Intake/Output from previous day: 05/01 0701 - 05/02 0700 In: 2817.2 [P.O.:1800; I.V.:717.2; IV Piggyback:300] Out: 3400 [Urine:3400] Intake/Output this shift: Total I/O In: 120 [P.O.:120] Out: -   Recent Labs    09/19/19 0259  HGB 13.0   Recent Labs    09/19/19 0259  WBC 10.4  RBC 4.45  HCT 40.4  PLT 168   Recent Labs    09/19/19 0259  NA 142  K 3.8  CL 111  CO2 25  BUN 13  CREATININE 0.91  GLUCOSE 137*  CALCIUM 8.6*   No results for input(s): LABPT, INR in the last 72 hours.  Neurologically intact No results found.  Assessment/Plan: 2 Days Post-Op Procedure(s) (LRB): RIGHT TOTAL HIP ARTHROPLASTY ANTERIOR APPROACH (Right) Up with therapy, discharge today after stairs.   Eldred Manges 09/20/2019, 10:07 AM

## 2019-09-20 NOTE — Progress Notes (Signed)
Physical Therapy Treatment Patient Details Name: Cody Huber MRN: [id number] DOB: 11-20-1960 Today's Date: 09/20/2019    History of Present Illness Patient is 59 y.o. male s/p Rt THA anterior approach on 09/18/19 with PMH significant for inguinal hernia, HLD, eczema.    PT Comments    Pt progressing well today. incr gait/distance tolerance. Reviewed bed mobility to flat bed, no rails, stairs, gait/safety. Pt is ready for  D/c from PT standpoint.   Follow Up Recommendations  Follow surgeon's recommendation for DC plan and follow-up therapies;Home health PT     Equipment Recommendations  Rolling walker with 5" wheels;3in1 (PT)    Recommendations for Other Services       Precautions / Restrictions Precautions Precautions: Fall Restrictions Weight Bearing Restrictions: No Other Position/Activity Restrictions: WBAT    Mobility  Bed Mobility Overal bed mobility: Needs Assistance Bed Mobility: Supine to Sit;Sit to Supine     Supine to sit: Supervision Sit to supine: Supervision   General bed mobility comments: cues for technique, bed flat, gait belt used as leg lifter   Transfers Overall transfer level: Needs assistance Equipment used: Rolling walker (2 wheeled) Transfers: Sit to/from Stand Sit to Stand: Supervision         General transfer comment: cues for hand placement and LLE position  Ambulation/Gait Ambulation/Gait assistance: Supervision;Min guard Gait Distance (Feet): 200 Feet Assistive device: Rolling walker (2 wheeled) Gait Pattern/deviations: Step-to pattern;Decreased weight shift to right;Decreased stance time - right Gait velocity: decreased   General Gait Details: no dizziness today, cues for sequence, reassurance regarding placing incr weight on RLE     Stairs Stairs: Yes Stairs assistance: Min guard Stair Management: One rail Right;One rail Left;Step to pattern;Sideways Number of Stairs: 5(x2) General stair comments: cues for sequence and  technique   Wheelchair Mobility    Modified Rankin (Stroke Patients Only)       Balance                                            Cognition Arousal/Alertness: Awake/alert Behavior During Therapy: WFL for tasks assessed/performed Overall Cognitive Status: Within Functional Limits for tasks assessed                                        Exercises      General Comments        Pertinent Vitals/Pain Pain Assessment: 0-10 Pain Score: 4  Pain Location: Rt hip Pain Descriptors / Indicators: Tender;Operative site guarding;Sore Pain Intervention(s): Limited activity within patient's tolerance;Monitored during session    Home Living                      Prior Function            PT Goals (current goals can now be found in the care plan section) Acute Rehab PT Goals Patient Stated Goal: to get walking without cane and go on vacation at end of June PT Goal Formulation: With patient Time For Goal Achievement: 09/25/19 Potential to Achieve Goals: Good Progress towards PT goals: Progressing toward goals    Frequency    7X/week      PT Plan Current plan remains appropriate    Co-evaluation  AM-PAC PT "6 Clicks" Mobility   Outcome Measure  Help needed turning from your back to your side while in a flat bed without using bedrails?: None Help needed moving from lying on your back to sitting on the side of a flat bed without using bedrails?: None Help needed moving to and from a bed to a chair (including a wheelchair)?: A Little Help needed standing up from a chair using your arms (e.g., wheelchair or bedside chair)?: A Little Help needed to walk in hospital room?: A Little Help needed climbing 3-5 steps with a railing? : A Little 6 Click Score: 20    End of Session Equipment Utilized During Treatment: Gait belt Activity Tolerance: Patient tolerated treatment well Patient left: in chair;with call  bell/phone within reach;with chair alarm set Nurse Communication: Mobility status PT Visit Diagnosis: Muscle weakness (generalized) (M62.81);Difficulty in walking, not elsewhere classified (R26.2)     Time: 7494-4967 PT Time Calculation (min) (ACUTE ONLY): 23 min  Charges:  $Gait Training: 23-37 mins                      Chesley Veasey, PT   Acute Rehab Dept [facility]): [phone]   09/20/2019    [facility] 09/20/2019, 11:05 AM

## 2019-09-21 ENCOUNTER — Telehealth: Payer: Self-pay | Admitting: Orthopaedic Surgery

## 2019-09-21 ENCOUNTER — Encounter: Payer: Self-pay | Admitting: *Deleted

## 2019-09-21 ENCOUNTER — Encounter: Payer: Self-pay | Admitting: Orthopaedic Surgery

## 2019-09-21 NOTE — Discharge Summary (Signed)
Patient ID: Cody Huber MRN: [id number] DOB/AGE: 07/23/1960 59 y.o.  Admit date: 09/18/2019 Discharge date: 09/21/2019  Admission Diagnoses:  Principal Problem:   Unilateral primary osteoarthritis, right hip Active Problems:   Status post total replacement of right hip   Discharge Diagnoses:  Same  Past Medical History:  Diagnosis Date  . Eczema   . Elevated cholesterol   . Hyperlipidemia   . Nasal congestion   . Right inguinal hernia     Surgeries: Procedure(s): RIGHT TOTAL HIP ARTHROPLASTY ANTERIOR APPROACH on 09/18/2019   Consultants:   Discharged Condition: Improved  Hospital Course: Cody Huber is an 59 y.o. male who was admitted 09/18/2019 for operative treatment ofUnilateral primary osteoarthritis, right hip. Patient has severe unremitting pain that affects sleep, daily activities, and work/hobbies. After pre-op clearance the patient was taken to the operating room on 09/18/2019 and underwent  Procedure(s): RIGHT TOTAL HIP ARTHROPLASTY ANTERIOR APPROACH.    Patient was given perioperative antibiotics:  Anti-infectives (From admission, onward)   Start     Dose/Rate Route Frequency Ordered Stop   09/18/19 1500  ceFAZolin (ANCEF) IVPB 1 g/50 mL premix     1 g 100 mL/hr over 30 Minutes Intravenous Every 6 hours 09/18/19 1157 09/18/19 2113   09/18/19 0630  ceFAZolin (ANCEF) IVPB 2g/100 mL premix     2 g 200 mL/hr over 30 Minutes Intravenous On call to O.R. 09/18/19 7741 09/18/19 2878       Patient was given sequential compression devices, early ambulation, and chemoprophylaxis to prevent DVT.  Patient benefited maximally from hospital stay and there were no complications.    Recent vital signs: No data found.   Recent laboratory studies:  Recent Labs    09/19/19 0259  WBC 10.4  HGB 13.0  HCT 40.4  PLT 168  NA 142  K 3.8  CL 111  CO2 25  BUN 13  CREATININE 0.91  GLUCOSE 137*  CALCIUM 8.6*     Discharge Medications:   Allergies as of  09/20/2019   No Known Allergies     Medication List    TAKE these medications   aspirin 81 MG chewable tablet Chew 1 tablet (81 mg total) by mouth 2 (two) times daily.   atorvastatin 40 MG tablet Commonly known as: LIPITOR Take 1 tablet (40 mg total) by mouth daily.   fenofibrate micronized 200 MG capsule Commonly known as: LOFIBRA Take 1 capsule (200 mg total) by mouth at bedtime.   HYDROcodone-acetaminophen 5-325 MG tablet Commonly known as: NORCO/VICODIN Take 1-2 tablets by mouth every 6 (six) hours as needed for moderate pain.   ibuprofen 200 MG tablet Commonly known as: ADVIL Take 200 mg by mouth 2 (two) times daily.   METAMUCIL PO Take 15 mLs by mouth at bedtime.   methocarbamol 500 MG tablet Commonly known as: ROBAXIN Take 1 tablet (500 mg total) by mouth every 6 (six) hours as needed for muscle spasms.   Probiotic Daily Caps Take 1 capsule by mouth daily. 5 billion live cells       Diagnostic Studies: DG Pelvis Portable  Result Date: 09/18/2019 CLINICAL DATA:  Postop right hip replacement EXAM: PORTABLE PELVIS 1-2 VIEWS COMPARISON:  None. FINDINGS: Changes of right hip replacement. Normal AP alignment. No hardware bony complicating feature. Mild degenerative changes within the left hip. No acute bony abnormality. IMPRESSION: Changes of right hip replacement.  No visible complicating feature. Electronically Signed   By: Charlett Nose M.D.   On: 09/18/2019 11:19  DG C-Arm 1-60 Min-No Report  Result Date: 09/18/2019 Fluoroscopy was utilized by the requesting physician.  No radiographic interpretation.   DG HIP OPERATIVE UNILAT WITH PELVIS RIGHT  Result Date: 09/18/2019 CLINICAL DATA:  Right hip replacement EXAM: OPERATIVE RIGHT HIP (WITH PELVIS IF PERFORMED) 1 VIEWS TECHNIQUE: Fluoroscopic spot image(s) were submitted for interpretation post-operatively. COMPARISON:  05/26/2019 FINDINGS: Intraoperative fluoroscopic images of the right hip are submitted for review  demonstrating total arthroplasty. No evidence of perihardware fracture or component malpositioning. IMPRESSION: Intraoperative fluoroscopic images of the right hip are submitted for review demonstrating total arthroplasty. No evidence of perihardware fracture or component malpositioning. Electronically Signed   By: Eddie Candle M.D.   On: 09/18/2019 10:05    Disposition: Discharge disposition: 01-Home or [place], [facility] Follow up.   Specialty: [place] Why: to provide home health physical therapy Contact information: [address] [place] [address] [phone]        Mcarthur Rossetti, MD Follow up in 2 week(s).   Specialty: Orthopedic Surgery Contact information: [address] [place] [address] [phone]            Signed: Mcarthur Rossetti 09/21/2019, 12:13 PM

## 2019-09-21 NOTE — Telephone Encounter (Signed)
I guess he can just try dry dressing.  I thought I had sent him home with an extra Aquacel dressing to change if needed.

## 2019-09-21 NOTE — Telephone Encounter (Signed)
Pt called wanting to know where he could get a surgical bandage, pt states he tried his pharmacy and surgical store and they didn't have anything.   [phone]

## 2019-09-21 NOTE — Telephone Encounter (Signed)
Can he just put dry dressing on it

## 2019-09-28 ENCOUNTER — Other Ambulatory Visit: Payer: Self-pay

## 2019-09-28 ENCOUNTER — Ambulatory Visit (HOSPITAL_COMMUNITY)
Admission: RE | Admit: 2019-09-28 | Discharge: 2019-09-28 | Disposition: A | Discharge status: Home / Self Care | Payer: BC Managed Care – PPO | Source: Ambulatory Visit | Attending: Orthopaedic Surgery | Admitting: Orthopaedic Surgery

## 2019-09-28 ENCOUNTER — Telehealth: Payer: Self-pay

## 2019-09-28 DIAGNOSIS — M79661 Pain in right lower leg: Secondary | ICD-10-CM | POA: Diagnosis not present

## 2019-09-28 NOTE — Progress Notes (Signed)
Right lower extremity venous duplex completed. Refer to "CV Proc" under chart review to view preliminary results.  09/28/2019 4:09 PM Eula Fried., MHA, RVT, RDCS, RDMS

## 2019-09-28 NOTE — Telephone Encounter (Signed)
He needs an ultrasound to rule out a DVT.

## 2019-09-28 NOTE — Telephone Encounter (Signed)
FYI

## 2019-09-28 NOTE — Telephone Encounter (Signed)
Patient scheduled today at [facility] @3 :00, patient aware

## 2019-09-28 NOTE — Telephone Encounter (Signed)
Patient called stating that he has swelling in his right hip and right ankle and wasn't sure if he needed to be seen before his appointment on Thursday, 10/01/2019.  Stated that he had spoken with the on call doctor over the weekend and was advised that he may have a blood clot.  Patient had right THA surgery on 09/18/2019.  Stated that he lives in [place].  Cb# [phone].  Please advise.

## 2019-09-28 NOTE — Telephone Encounter (Signed)
Cody Huber at [facility] called stating that patient is Negative for DVT, right leg.  Please advise.  Thank you.

## 2019-10-01 ENCOUNTER — Encounter: Payer: Self-pay | Admitting: Orthopaedic Surgery

## 2019-10-01 ENCOUNTER — Ambulatory Visit (INDEPENDENT_AMBULATORY_CARE_PROVIDER_SITE_OTHER): Payer: BC Managed Care – PPO | Admitting: Orthopaedic Surgery

## 2019-10-01 ENCOUNTER — Other Ambulatory Visit: Payer: Self-pay

## 2019-10-01 DIAGNOSIS — Z96641 Presence of right artificial hip joint: Secondary | ICD-10-CM

## 2019-10-01 MED ORDER — HYDROCODONE-ACETAMINOPHEN 5-325 MG PO TABS: 1.0000 | ORAL_TABLET | Freq: Four times a day (QID) | ORAL | 0 refills | Status: DC | PRN

## 2019-10-01 NOTE — Progress Notes (Signed)
The patient is 2 weeks tomorrow status post a right total hip arthroplasty.  Postoperatively we did send him for ultrasound to the swelling in his leg and this was negative for DVT.  He is doing much better overall and said the swelling is gone down.  He has been on aspirin twice a day.  He has been taking his hydrocodone and Robaxin as needed.  He does report he is getting along slowly.  Notes from therapy state that he is doing well.  He agrees with this as well he says is just been a slow process.  They do have a few more physical therapy visits with him at home.  On examination his right hip incision looks good so the staples removed and Steri-Strips applied.  The swelling in his leg is minimal at this standpoint.  We had a good and thorough discussion about what to expect postoperatively.  All questions and concerns were answered and addressed.  He will go down to once a day aspirin for a week and then can stop aspirin.  I will send in some more pain medicine for him.

## 2019-10-05 ENCOUNTER — Encounter: Payer: Self-pay | Admitting: Orthopaedic Surgery

## 2019-10-06 ENCOUNTER — Other Ambulatory Visit: Payer: Self-pay

## 2019-10-06 DIAGNOSIS — Z96641 Presence of right artificial hip joint: Secondary | ICD-10-CM

## 2019-10-09 ENCOUNTER — Ambulatory Visit (INDEPENDENT_AMBULATORY_CARE_PROVIDER_SITE_OTHER): Payer: BC Managed Care – PPO | Admitting: Physical Therapy

## 2019-10-09 ENCOUNTER — Encounter: Payer: Self-pay | Admitting: Physical Therapy

## 2019-10-09 ENCOUNTER — Other Ambulatory Visit: Payer: Self-pay

## 2019-10-09 DIAGNOSIS — M6281 Muscle weakness (generalized): Secondary | ICD-10-CM

## 2019-10-09 DIAGNOSIS — M25651 Stiffness of right hip, not elsewhere classified: Secondary | ICD-10-CM | POA: Diagnosis not present

## 2019-10-09 DIAGNOSIS — M25551 Pain in right hip: Secondary | ICD-10-CM | POA: Diagnosis not present

## 2019-10-09 DIAGNOSIS — R2689 Other abnormalities of gait and mobility: Secondary | ICD-10-CM | POA: Diagnosis not present

## 2019-10-09 NOTE — Therapy (Signed)
[facility] [address] [place], [place], [address] Phone: [phone]   Fax:  [phone]  Physical Therapy Evaluation  Patient Details  Name: Cody Huber MRN: [id number] Date of Birth: 05-12-1961 Referring Provider (PT): Doneen Poisson   Encounter Date: 10/09/2019  PT End of Session - 10/09/19 1323    Visit Number  1    Number of Visits  12    Date for PT Re-Evaluation  11/20/19    PT Start Time  1325    PT Stop Time  1415    PT Time Calculation (min)  50 min    Equipment Utilized During Treatment  Gait belt    Activity Tolerance  Patient tolerated treatment well    Behavior During Therapy  [facility] for tasks assessed/performed       Past Medical History:  Diagnosis Date  . Eczema   . Elevated cholesterol   . Hyperlipidemia   . Nasal congestion   . Right inguinal hernia     Past Surgical History:  Procedure Laterality Date  . HEMORRHOID SURGERY  2006 - approximate  . INGUINAL HERNIA REPAIR  07/24/2011   Procedure: LAPAROSCOPIC BILATERAL INGUINAL HERNIA REPAIR;  Surgeon: Ardeth Sportsman, MD;  Location: [facility];  Service: General;  Laterality: Bilateral;  . TOTAL HIP ARTHROPLASTY Right 09/18/2019   Procedure: RIGHT TOTAL HIP ARTHROPLASTY ANTERIOR APPROACH;  Surgeon: Kathryne Hitch, MD;  Location: [facility];  Service: Orthopedics;  Laterality: Right;    There were no vitals filed for this visit.   Subjective Assessment - 10/09/19 1326    Subjective  Pt states he had an anterior approach R THA surgery on 09/18/19. Pt notes he is off pain medicine and working his hip exercises at home morning and evening. He has finished his course of HHPT (he had 4-5 visits). Reports aching, pain, soreness and stiffness along hip and knee. Pt states he switched over to the cane this week (HHPT did not suggest this). He had been using a RW at home. 1/10 at rest 2 or 3/10 at worst during activity    Limitations   Walking;Other (comment);House hold activities    How long can you walk comfortably?  500 feet    Patient Stated Goals  Pt has a big trip to [place] and would like to be able walk a mile.    Currently in Pain?  Yes    Pain Score  3     Pain Location  Hip    Pain Orientation  Right    Pain Descriptors / Indicators  Aching    Pain Type  Surgical pain    Pain Radiating Towards  hip to knee    Pain Onset  1 to 4 weeks ago    Pain Frequency  Intermittent    Aggravating Factors   Increased activity         [facility] PT Assessment - 10/09/19 0001      Assessment   Medical Diagnosis  Z96.641 (ICD-10-CM) - Status post total replacement of right hip    Referring Provider (PT)  Doneen Poisson    Onset Date/Surgical Date  09/18/19    Prior Therapy  HHPT for R THA      Precautions   Precautions  Anterior Hip      Restrictions   Weight Bearing Restrictions  No      Balance Screen   Has the patient fallen in the past 6 months  No  Home Environment   Living Environment  Private residence    Living Arrangements  Spouse/significant other    Available Help at Discharge  Family    Type of Home  House    Home Access  Stairs to enter    Entrance Stairs-Number of Steps  5    Entrance Stairs-Rails  Can reach both    Home Layout  One level    Home Equipment  Walker - 2 wheels;Cane - single point;Toilet riser      Prior Function   Level of Independence  Independent with household mobility with device      Observation/Other Assessments   Observations  Edema and bruising above R knee; decreased lateral quad musculature on R vs L      Posture/Postural Control   Posture Comments  Decreased R LE weightbearing in standing; hips shifted laterally      AROM   Right Hip Flexion  90    Left Hip Flexion  125      Strength   Right Hip Flexion  3+/5    Right Hip Extension  3+/5    Right Hip ABduction  4/5    Left Hip Flexion  5/5    Left Hip Extension  4/5    Left Hip ABduction  5/5     Right Knee Flexion  5/5    Right Knee Extension  4/5    Left Knee Flexion  5/5    Left Knee Extension  5/5      Flexibility   Hamstrings  tight    Quadriceps  Tight in prone    ITB  Tight on palpation      Transfers   Comments  Uses L LE to hook under R ankle to come into supine      Ambulation/Gait   Ambulation Distance (Feet)  100 Feet    Assistive device  Straight cane    Gait Pattern  Decreased step length - left;Decreased arm swing - left;Decreased stance time - right;Decreased weight shift to right;Antalgic    Ambulation Surface  Level                  Objective measurements completed on examination: See above findings.      [facility] Adult PT Treatment/Exercise - 10/09/19 0001      Ambulation/Gait   Gait Comments  Improved gait with verbal and tactile cueing for increased R LE stance, symmetrical step length and decreased lateral truncal lean      Knee/Hip Exercises: Stretches   Active Hamstring Stretch  3 reps;30 seconds   In seated   Hip Flexor Stretch  3 reps;30 seconds   In standing     Knee/Hip Exercises: Standing   Other Standing Knee Exercises  Weightshifting L<>R x 10 reps in front of mirror for improved standing posture      Manual Therapy   Manual therapy comments  STM on ITB               PT Short Term Goals - 10/09/19 1437      PT SHORT TERM GOAL #1   Title  Pt will be independent with HEP    Time  3    Period  Weeks    Status  New    Target Date  10/30/19        PT Long Term Goals - 10/09/19 1438      PT LONG TERM GOAL #1   Title  Pt will have normal reciprocal  gait pattern with no a/d    Baseline  Antalgic R hip; asymmetrical step length, decreased R LE stance time with trendelenburg    Time  6    Period  Weeks    Status  New    Target Date  11/20/19      PT LONG TERM GOAL #2   Title  Pt will have increased R hip strength to 5/5    Baseline  R hip flexion 3+/5; extension 4/5    Time  6    Period  Weeks     Status  New    Target Date  11/20/19      PT LONG TERM GOAL #3   Title  Pt will have improved hamstring, TFL and ITB extensbility for decreased soreness    Time  6    Period  Weeks    Status  New    Target Date  11/20/19      PT LONG TERM GOAL #4   Title  Pt will be able to tolerate walking a mile    Baseline  Becomes fatigued and sore with ambulating in grocery store    Time  6    Period  Weeks    Status  New    Target Date  11/20/19             Plan - 10/09/19 1427    Clinical Impression Statement  Pt is a 59 y.o. M presenting to OPPT 3 weeks s/p R THA (anterior approach) on 09/18/19. Pt demonstrates decreased R hip strength, tightness and pain affecting pt's gait, transfers, and mobility limiting community mobility and home tasks. Pt would benefit form therapy to address these issues and reach pt goals for increased amb. End of session focused on gentle stretching and gait mechanics.    Personal Factors and Comorbidities  Age;Comorbidity 1    Comorbidities  Arthritis    Examination-Activity Limitations  Bed Mobility;Locomotion Level;Bend;Transfers;Lift    Examination-Participation Restrictions  Community Activity;Yard Work    Stability/Clinical Decision Making  Stable/Uncomplicated    Optometrist  Low    Rehab Potential  Good    PT Frequency  Other (comment)   3x/wk on first week; taper to 2x/wk therafter   PT Duration  6 weeks    PT Treatment/Interventions  ADLs/Self Care Home Management;Cryotherapy;Electrical Stimulation;Iontophoresis 4mg /ml Dexamethasone;Moist Heat;Traction;Ultrasound;DME Instruction;Gait training;Stair training;Functional mobility training;Therapeutic activities;Therapeutic exercise;Balance training;Neuromuscular re-education;Patient/family education;Manual techniques;Scar mobilization;Passive range of motion;Dry needling;Taping;Vasopneumatic Device    PT Next Visit Plan  Review HEP. Strengthen and stretch as needed. Continue gait training.  Consider vaso and e-stim.    PT Home Exercise Plan  [id number]    Consulted and Agree with Plan of Care  Patient       Patient will benefit from skilled therapeutic intervention in order to improve the following deficits and impairments:  Abnormal gait, Decreased coordination, Difficulty walking, Increased fascial restricitons, Decreased endurance, Decreased activity tolerance, Pain, Decreased balance, Hypomobility, Improper body mechanics, Decreased mobility, Decreased strength, Increased edema  Visit Diagnosis: Pain in right hip  Stiffness of right hip, not elsewhere classified  Muscle weakness (generalized)  Other abnormalities of gait and mobility     Problem List Patient Active Problem List   Diagnosis Date Noted  . Status post total replacement of right hip 09/18/2019  . Unilateral primary osteoarthritis, right hip 05/26/2019  . Abnormal glucose 06/19/2016  . Family history of aneurysm 06/09/2014  . AR (allergic rhinitis) 05/20/2012  . Obturator hernia, right 08/13/2011  .  Bilateral inguinal hernia (R>>L) 07/10/2011  . Diastasis recti 07/10/2011  . Hyperlipemia 10/21/2008    [facility] April Ma L Garnet Chatmon PT, DPT 10/09/2019, 3:11 PM  [facility] [place], [place], [address] Phone: [phone]   Fax:  [phone]  Name: SEQUAN AUXIER MRN: [id number] Date of Birth: March 30, 1961

## 2019-10-12 ENCOUNTER — Ambulatory Visit (INDEPENDENT_AMBULATORY_CARE_PROVIDER_SITE_OTHER): Payer: BC Managed Care – PPO | Admitting: Physical Therapy

## 2019-10-12 ENCOUNTER — Other Ambulatory Visit: Payer: Self-pay

## 2019-10-12 DIAGNOSIS — M25551 Pain in right hip: Secondary | ICD-10-CM | POA: Diagnosis not present

## 2019-10-12 DIAGNOSIS — M6281 Muscle weakness (generalized): Secondary | ICD-10-CM

## 2019-10-12 DIAGNOSIS — M25651 Stiffness of right hip, not elsewhere classified: Secondary | ICD-10-CM

## 2019-10-12 DIAGNOSIS — R2689 Other abnormalities of gait and mobility: Secondary | ICD-10-CM

## 2019-10-12 NOTE — Therapy (Signed)
[facility] [place], [place], [address] Phone: [phone]   Fax:  [phone]  Physical Therapy Treatment  Patient Details  Name: Cody Huber MRN: [id number] Date of Birth: 1960-10-16 Referring Provider (PT): Jean Rosenthal   Encounter Date: 10/12/2019  PT End of Session - 10/12/19 1103    Visit Number  2    Number of Visits  12    Date for PT Re-Evaluation  11/20/19    PT Start Time  1105    PT Stop Time  1156    PT Time Calculation (min)  51 min    Equipment Utilized During Treatment  Gait belt    Activity Tolerance  Patient tolerated treatment well    Behavior During Therapy  [facility] for tasks assessed/performed       Past Medical History:  Diagnosis Date  . Eczema   . Elevated cholesterol   . Hyperlipidemia   . Nasal congestion   . Right inguinal hernia     Past Surgical History:  Procedure Laterality Date  . HEMORRHOID SURGERY  2006 - approximate  . INGUINAL HERNIA REPAIR  07/24/2011   Procedure: LAPAROSCOPIC BILATERAL INGUINAL HERNIA REPAIR;  Surgeon: Adin Hector, MD;  Location: [facility];  Service: General;  Laterality: Bilateral;  . TOTAL HIP ARTHROPLASTY Right 09/18/2019   Procedure: RIGHT TOTAL HIP ARTHROPLASTY ANTERIOR APPROACH;  Surgeon: Mcarthur Rossetti, MD;  Location: [facility];  Service: Orthopedics;  Laterality: Right;    There were no vitals filed for this visit.  Subjective Assessment - 10/12/19 1210    Subjective  Pt reports soreness for 2 days after initial evaluation. He states it eased off and he has been stretching and working on his gait mechanics at home. Pt notes he walks 2x/day at least 500'.    Limitations  Walking;Other (comment);House hold activities    How long can you walk comfortably?  500 feet    Patient Stated Goals  Pt has a big trip to [place] and would like to be able walk a mile.    Currently in Pain?  Yes    Pain Score  3     Pain Location   Hip    Pain Orientation  Right    Pain Descriptors / Indicators  Aching    Pain Onset  1 to 4 weeks ago                        [facility] Adult PT Treatment/Exercise - 10/12/19 0001      Transfers   Comments  Uses L LE to hook under R ankle to come into supine      Ambulation/Gait   Ambulation/Gait  Yes    Ambulation/Gait Assistance  4: Min guard   Tactile and v/cs for weight shifting   Ambulation Distance (Feet)  300 Feet    Assistive device  Straight cane   100' without cane, 200' with cane   Gait Pattern  Step-through pattern;Lateral trunk lean to right;Trendelenburg   R hip trendelenburgs with weight bearing   Ambulation Surface  Level    Pre-Gait Activities  Forward/backward stepping 3 sets x 5 feet      Knee/Hip Exercises: Stretches   Active Hamstring Stretch  3 reps;30 seconds;Both   in supine with strap   Hip Flexor Stretch  3 reps;30 seconds;Left    ITB Stretch  Both;30 seconds    Piriformis Stretch  Both;30 seconds  in sitting     Knee/Hip Exercises: Standing   Hip Flexion  AROM;10 reps   Limited due to R hip pain   Hip Abduction  Stengthening;10 reps   Stabilizing on R, L hip abducting   Other Standing Knee Exercises  Weightshifting L<>R 2 sets x 10 reps in front of mirror for improved standing posture    Other Standing Knee Exercises  Weightshifting forward/backward 2sets x10 reps      Manual Therapy   Manual therapy comments  STM on ITB and TFL             PT Education - 10/12/19 1212    Education Details  Reviewed HEP, discussed using ball at home for myofacial release, discussed easing off his amount of walking and should not be walking with dysfunctional gait mechanics    Person(s) Educated  Patient    Methods  Explanation;Handout;Demonstration    Comprehension  Verbalized understanding       PT Short Term Goals - 10/09/19 1437      PT SHORT TERM GOAL #1   Title  Pt will be independent with HEP    Time  3    Period  Weeks     Status  New    Target Date  10/30/19        PT Long Term Goals - 10/09/19 1438      PT LONG TERM GOAL #1   Title  Pt will have normal reciprocal gait pattern with no a/d    Baseline  Antalgic R hip; asymmetrical step length, decreased R LE stance time with trendelenburg    Time  6    Period  Weeks    Status  New    Target Date  11/20/19      PT LONG TERM GOAL #2   Title  Pt will have increased R hip strength to 5/5    Baseline  R hip flexion 3+/5; extension 4/5    Time  6    Period  Weeks    Status  New    Target Date  11/20/19      PT LONG TERM GOAL #3   Title  Pt will have improved hamstring, TFL and ITB extensbility for decreased soreness    Time  6    Period  Weeks    Status  New    Target Date  11/20/19      PT LONG TERM GOAL #4   Title  Pt will be able to tolerate walking a mile    Baseline  Becomes fatigued and sore with ambulating in grocery store    Time  6    Period  Weeks    Status  New    Target Date  11/20/19            Plan - 10/12/19 1207    Clinical Impression Statement  Improved gait pattern upon presentation to clinic today, increased cane height for improved fit. Continues to have R hip tightness and pain/soreness addressed with STM and stretching. Initiated short distance amb without a/d. Treatment focused on stretching, manual therapy, light strengthening, and gait. Pt tolerated well -- reports he does feel the hip is less tight.    Personal Factors and Comorbidities  Age;Comorbidity 1    Comorbidities  Arthritis    Examination-Activity Limitations  Bed Mobility;Locomotion Level;Bend;Transfers;Lift    Rehab Potential  Good    PT Frequency  Other (comment)    PT Duration  6 weeks  PT Treatment/Interventions  ADLs/Self Care Home Management;Cryotherapy;Electrical Stimulation;Iontophoresis 4mg /ml Dexamethasone;Moist Heat;Traction;Ultrasound;DME Instruction;Gait training;Stair training;Functional mobility training;Therapeutic  activities;Therapeutic exercise;Balance training;Neuromuscular re-education;Patient/family education;Manual techniques;Scar mobilization;Passive range of motion;Dry needling;Taping;Vasopneumatic Device    PT Next Visit Plan  Review HEP. Strengthen and stretch as tolerated. Continue gait training. Consider taping for bruising.    PT Home Exercise Plan     Consulted and Agree with Plan of Care  Patient       Patient will benefit from skilled therapeutic intervention in order to improve the following deficits and impairments:  Abnormal gait, Decreased coordination, Difficulty walking, Increased fascial restricitons, Decreased endurance, Decreased activity tolerance, Pain, Decreased balance, Hypomobility, Improper body mechanics, Decreased mobility, Decreased strength, Increased edema  Visit Diagnosis: Stiffness of right hip, not elsewhere classified  Pain in right hip  Muscle weakness (generalized)  Other abnormalities of gait and mobility     Problem List Patient Active Problem List   Diagnosis Date Noted  . Status post total replacement of right hip 09/18/2019  . Unilateral primary osteoarthritis, right hip 05/26/2019  . Abnormal glucose 06/19/2016  . Family history of aneurysm 06/09/2014  . AR (allergic rhinitis) 05/20/2012  . Obturator hernia, right 08/13/2011  . Bilateral inguinal hernia (R>>L) 07/10/2011  . Diastasis recti 07/10/2011  . Hyperlipemia 10/21/2008    [facility] April Ma L Amijah Timothy PT, DPT 10/12/2019, 12:14 PM  [facility] [address] [place], [place], [place] Phone: [phone]   Fax:  [phone]  Name: Cody Huber MRN: Laurian Brim Date of Birth: 04-12-1961

## 2019-10-14 ENCOUNTER — Ambulatory Visit (INDEPENDENT_AMBULATORY_CARE_PROVIDER_SITE_OTHER): Payer: BC Managed Care – PPO | Admitting: Physical Therapy

## 2019-10-14 ENCOUNTER — Other Ambulatory Visit: Payer: Self-pay

## 2019-10-14 ENCOUNTER — Encounter: Payer: Self-pay | Admitting: Physical Therapy

## 2019-10-14 DIAGNOSIS — M25551 Pain in right hip: Secondary | ICD-10-CM

## 2019-10-14 DIAGNOSIS — M25651 Stiffness of right hip, not elsewhere classified: Secondary | ICD-10-CM | POA: Diagnosis not present

## 2019-10-14 DIAGNOSIS — M6281 Muscle weakness (generalized): Secondary | ICD-10-CM | POA: Diagnosis not present

## 2019-10-14 DIAGNOSIS — R2689 Other abnormalities of gait and mobility: Secondary | ICD-10-CM | POA: Diagnosis not present

## 2019-10-14 NOTE — Therapy (Signed)
[facility] [place] [address] [place], [place], [address] Phone: [phone]   Fax:  [phone]  Physical Therapy Treatment  Patient Details  Name: Cody Huber MRN: [id number] Date of Birth: [birth year] Referring Provider (PT): Doneen Poisson   Encounter Date: 10/14/2019  PT End of Session - 10/14/19 1150    Visit Number  3    Number of Visits  12    Date for PT Re-Evaluation  11/20/19    PT Start Time  1150    PT Stop Time  1240    PT Time Calculation (min)  50 min    Activity Tolerance  Patient tolerated treatment well    Behavior During Therapy  [facility] for tasks assessed/performed       Past Medical History:  Diagnosis Date  . Eczema   . Elevated cholesterol   . Hyperlipidemia   . Nasal congestion   . Right inguinal hernia     Past Surgical History:  Procedure Laterality Date  . HEMORRHOID SURGERY  2006 - approximate  . INGUINAL HERNIA REPAIR  07/24/2011   Procedure: LAPAROSCOPIC BILATERAL INGUINAL HERNIA REPAIR;  Surgeon: Ardeth Sportsman, MD;  Location: [facility];  Service: General;  Laterality: Bilateral;  . TOTAL HIP ARTHROPLASTY Right 09/18/2019   Procedure: RIGHT TOTAL HIP ARTHROPLASTY ANTERIOR APPROACH;  Surgeon: Kathryne Hitch, MD;  Location: [facility];  Service: Orthopedics;  Laterality: Right;    There were no vitals filed for this visit.  Subjective Assessment - 10/14/19 1153    Subjective  Pt reports a little sore today. Pt states rubbing the ball against his muscles helped. Seems that pain has improved    Limitations  Walking;Other (comment);House hold activities    How long can you walk comfortably?  500 feet    Patient Stated Goals  Pt has a big trip to [place] and would like to be able walk a mile.    Currently in Pain?  Yes    Pain Score  2     Pain Location  Hip    Pain Orientation  Right    Pain Descriptors / Indicators  Aching    Pain Onset  1 to 4 weeks ago                         [facility] Adult PT Treatment/Exercise - 10/14/19 0001      Ambulation/Gait   Ambulation Distance (Feet)  400 Feet    Assistive device  Straight cane   200' performed without a/d   Gait Pattern  Step-through pattern;Decreased arm swing - right   Mild R lateral trunk lean on R     Knee/Hip Exercises: Stretches   ITB Stretch  Right;30 seconds   Performed in sidelying due to pt c/o fatigue with supine   Piriformis Stretch  Both;30 seconds      Knee/Hip Exercises: Aerobic   Nustep  L4 x 6 min      Knee/Hip Exercises: Standing   Other Standing Knee Exercises  Side stepping R and L x 20'      Knee/Hip Exercises: Seated   Other Seated Knee/Hip Exercises  SLR in sitting bilaterally x 10 reps    Marching  Strengthening;Both;10 reps      Knee/Hip Exercises: Sidelying   Clams  10 reps      Manual Therapy   Manual therapy comments  STM and IASTM on ITB and TFL  PT Short Term Goals - 10/09/19 1437      PT SHORT TERM GOAL #1   Title  Pt will be independent with HEP    Time  3    Period  Weeks    Status  New    Target Date  10/30/19        PT Long Term Goals - 10/09/19 1438      PT LONG TERM GOAL #1   Title  Pt will have normal reciprocal gait pattern with no a/d    Baseline  Antalgic R hip; asymmetrical step length, decreased R LE stance time with trendelenburg    Time  6    Period  Weeks    Status  New    Target Date  11/20/19      PT LONG TERM GOAL #2   Title  Pt will have increased R hip strength to 5/5    Baseline  R hip flexion 3+/5; extension 4/5    Time  6    Period  Weeks    Status  New    Target Date  11/20/19      PT LONG TERM GOAL #3   Title  Pt will have improved hamstring, TFL and ITB extensbility for decreased soreness    Time  6    Period  Weeks    Status  New    Target Date  11/20/19      PT LONG TERM GOAL #4   Title  Pt will be able to tolerate walking a mile    Baseline  Becomes fatigued  and sore with ambulating in grocery store    Time  6    Period  Weeks    Status  New    Target Date  11/20/19            Plan - 10/14/19 1302    Clinical Impression Statement  Pt with improving pain. Gait continues to improve. Increased amb distance without a/d. Continues to have R anterolateral hip soreness addressed with IASTM and stretching with improving extensibility. Treatment focused on stretching, manual therapy, addition of hip strengthening exercises and progressing gait. Pt requesting stretches to address his chronic back pain for next session. Discussed with pt that he can be tapered down to 2x/wk next week.    Personal Factors and Comorbidities  Age;Comorbidity 1    Comorbidities  Arthritis    Examination-Activity Limitations  Bed Mobility;Locomotion Level;Bend;Transfers;Lift    Examination-Participation Restrictions  Community Activity;Yard Work    Rehab Potential  Good    PT Frequency  2x / week    PT Duration  6 weeks    PT Treatment/Interventions  ADLs/Self Care Home Management;Cryotherapy;Electrical Stimulation;Iontophoresis 4mg /ml Dexamethasone;Moist Heat;Traction;Ultrasound;DME Instruction;Gait training;Stair training;Functional mobility training;Therapeutic activities;Therapeutic exercise;Balance training;Neuromuscular re-education;Patient/family education;Manual techniques;Scar mobilization;Passive range of motion;Dry needling;Taping;Vasopneumatic Device    PT Next Visit Plan  Review HEP. Strengthen and stretch as tolerated. Continue gait training. Consider taping for bruising. Continue manual for ITB    PT Home Exercise Plan  [id number]    Consulted and Agree with Plan of Care  Patient       Patient will benefit from skilled therapeutic intervention in order to improve the following deficits and impairments:  Abnormal gait, Decreased coordination, Difficulty walking, Increased fascial restricitons, Decreased endurance, Decreased activity tolerance, Pain, Decreased  balance, Hypomobility, Improper body mechanics, Decreased mobility, Decreased strength, Increased edema  Visit Diagnosis: Stiffness of right hip, not elsewhere classified  Pain in right hip  Muscle weakness (  generalized)  Other abnormalities of gait and mobility     Problem List Patient Active Problem List   Diagnosis Date Noted  . Status post total replacement of right hip 09/18/2019  . Unilateral primary osteoarthritis, right hip 05/26/2019  . Abnormal glucose 06/19/2016  . Family history of aneurysm 06/09/2014  . AR (allergic rhinitis) 05/20/2012  . Obturator hernia, right 08/13/2011  . Bilateral inguinal hernia (R>>L) 07/10/2011  . Diastasis recti 07/10/2011  . Hyperlipemia 10/21/2008    [facility] April Ma L Kendrick Haapala PT, DPT 10/14/2019, 1:08 PM  [facility] [address] [place], [place], [address] Phone: [phone]   Fax:  [phone]  Name: BERTON BUTRICK MRN: [id number] Date of Birth: 25-Dec-1960

## 2019-10-16 ENCOUNTER — Encounter: Payer: Self-pay | Admitting: Physical Therapy

## 2019-10-16 ENCOUNTER — Other Ambulatory Visit: Payer: Self-pay

## 2019-10-16 ENCOUNTER — Ambulatory Visit (INDEPENDENT_AMBULATORY_CARE_PROVIDER_SITE_OTHER): Payer: BC Managed Care – PPO | Admitting: Physical Therapy

## 2019-10-16 DIAGNOSIS — M25651 Stiffness of right hip, not elsewhere classified: Secondary | ICD-10-CM

## 2019-10-16 DIAGNOSIS — R2689 Other abnormalities of gait and mobility: Secondary | ICD-10-CM

## 2019-10-16 DIAGNOSIS — M25551 Pain in right hip: Secondary | ICD-10-CM

## 2019-10-16 DIAGNOSIS — M6281 Muscle weakness (generalized): Secondary | ICD-10-CM

## 2019-10-16 NOTE — Therapy (Signed)
[facility] Outpatient [facility] [place] [address] [place], [place], [address] Phone: [phone]   Fax:  [phone]  Physical Therapy Treatment  Patient Details  Name: Cody Huber MRN: [id number] Date of Birth: 19-Oct-1960 Referring Provider (PT): Doneen Poisson   Encounter Date: 10/16/2019  PT End of Session - 10/16/19 1210    Visit Number  4    Number of Visits  12    Date for PT Re-Evaluation  11/20/19    PT Start Time  1105    PT Stop Time  1155    PT Time Calculation (min)  50 min    Activity Tolerance  Patient tolerated treatment well    Behavior During Therapy  [facility] for tasks assessed/performed       Past Medical History:  Diagnosis Date  . Eczema   . Elevated cholesterol   . Hyperlipidemia   . Nasal congestion   . Right inguinal hernia     Past Surgical History:  Procedure Laterality Date  . HEMORRHOID SURGERY  2006 - approximate  . INGUINAL HERNIA REPAIR  07/24/2011   Procedure: LAPAROSCOPIC BILATERAL INGUINAL HERNIA REPAIR;  Surgeon: Ardeth Sportsman, MD;  Location: [facility];  Service: General;  Laterality: Bilateral;  . TOTAL HIP ARTHROPLASTY Right 09/18/2019   Procedure: RIGHT TOTAL HIP ARTHROPLASTY ANTERIOR APPROACH;  Surgeon: Kathryne Hitch, MD;  Location: [facility];  Service: Orthopedics;  Laterality: Right;    There were no vitals filed for this visit.  Subjective Assessment - 10/16/19 1105    Subjective  Pt states he has been having a good week and feels he is getting more motion in the hip. Pt states he does feel like things are getting better. Pt states he has already did his HEP this morning and has been using the ball/rolling pin to get rid of the knots in his leg.    Limitations  Walking;Other (comment);House hold activities    How long can you walk comfortably?  500 feet    Patient Stated Goals  Pt has a big trip to [place] and would like to be able walk a mile.                         [facility] Adult PT Treatment/Exercise - 10/16/19 0001      Ambulation/Gait   Ambulation Distance (Feet)  200 Feet    Gait Pattern  Decreased arm swing - right;Step-through pattern   Mild trunk R lateral lean     Knee/Hip Exercises: Stretches   Active Hamstring Stretch  Both;30 seconds    ITB Stretch  Both;30 seconds   sidelying   Piriformis Stretch  Both;30 seconds    Other Knee/Hip Stretches  Standing "L" stretch forward, right and left reaching    Other Knee/Hip Stretches  Cat/cow in sitting 3 reps with 30 sec hold      Knee/Hip Exercises: Aerobic   Nustep  L5 x 6 min      Knee/Hip Exercises: Standing   Other Standing Knee Exercises  Single leg balance with clock reach x 10 forward, side and back      Manual Therapy   Manual therapy comments  STM and IASTM on ITB and TFL             PT Education - 10/16/19 1215    Education Details  Reviewed HEP modifications. Discussed continuing to improve gait dysfunction and hip stabilization.    Person(s) Educated  Patient  Methods  Explanation;Demonstration;Handout    Comprehension  Verbalized understanding       PT Short Term Goals - 10/09/19 1437      PT SHORT TERM GOAL #1   Title  Pt will be independent with HEP    Time  3    Period  Weeks    Status  New    Target Date  10/30/19        PT Long Term Goals - 10/09/19 1438      PT LONG TERM GOAL #1   Title  Pt will have normal reciprocal gait pattern with no a/d    Baseline  Antalgic R hip; asymmetrical step length, decreased R LE stance time with trendelenburg    Time  6    Period  Weeks    Status  New    Target Date  11/20/19      PT LONG TERM GOAL #2   Title  Pt will have increased R hip strength to 5/5    Baseline  R hip flexion 3+/5; extension 4/5    Time  6    Period  Weeks    Status  New    Target Date  11/20/19      PT LONG TERM GOAL #3   Title  Pt will have improved hamstring, TFL and ITB extensbility for  decreased soreness    Time  6    Period  Weeks    Status  New    Target Date  11/20/19      PT LONG TERM GOAL #4   Title  Pt will be able to tolerate walking a mile    Baseline  Becomes fatigued and sore with ambulating in grocery store    Time  6    Period  Weeks    Status  New    Target Date  11/20/19            Plan - 10/16/19 1212    Clinical Impression Statement  Pt with overall improving gait -- less LOBs when using no a/d. Pt with improving pain free hip ROM. Treatment focused on STM and IASTM of R anterolateral hip/ITB/TFL and stretching. Progressed pt to include standing hip stabilizing exercises. Provided pt with stretches for his back per his request.    Personal Factors and Comorbidities  Age;Comorbidity 1    Comorbidities  Arthritis    Examination-Activity Limitations  Bed Mobility;Locomotion Level;Bend;Transfers;Lift    Examination-Participation Restrictions  Community Activity;Yard Work       Patient will benefit from skilled therapeutic intervention in order to improve the following deficits and impairments:  Abnormal gait, Decreased coordination, Difficulty walking, Increased fascial restricitons, Decreased endurance, Decreased activity tolerance, Pain, Decreased balance, Hypomobility, Improper body mechanics, Decreased mobility, Decreased strength, Increased edema  Visit Diagnosis: Stiffness of right hip, not elsewhere classified  Pain in right hip  Muscle weakness (generalized)  Other abnormalities of gait and mobility     Problem List Patient Active Problem List   Diagnosis Date Noted  . Status post total replacement of right hip 09/18/2019  . Unilateral primary osteoarthritis, right hip 05/26/2019  . Abnormal glucose 06/19/2016  . Family history of aneurysm 06/09/2014  . AR (allergic rhinitis) 05/20/2012  . Obturator hernia, right 08/13/2011  . Bilateral inguinal hernia (R>>L) 07/10/2011  . Diastasis recti 07/10/2011  . Hyperlipemia  10/21/2008    Moesha Sarchet April Ma L Uziah Sorter PT, DPT 10/16/2019, 12:18 PM  [place] [address]  [address] [place], [place], [address] Phone: [phone]   Fax:  [phone]  Name: Cody Huber MRN: [id number] Date of Birth: 06/20/1960

## 2019-10-21 ENCOUNTER — Encounter: Payer: BC Managed Care – PPO | Admitting: Physical Therapy

## 2019-10-21 ENCOUNTER — Encounter: Payer: Self-pay | Admitting: Physical Therapy

## 2019-10-21 ENCOUNTER — Other Ambulatory Visit: Payer: Self-pay

## 2019-10-21 ENCOUNTER — Ambulatory Visit (INDEPENDENT_AMBULATORY_CARE_PROVIDER_SITE_OTHER): Payer: BC Managed Care – PPO | Admitting: Physical Therapy

## 2019-10-21 ENCOUNTER — Encounter: Payer: Self-pay | Admitting: Family Medicine

## 2019-10-21 DIAGNOSIS — R2689 Other abnormalities of gait and mobility: Secondary | ICD-10-CM

## 2019-10-21 DIAGNOSIS — M6281 Muscle weakness (generalized): Secondary | ICD-10-CM

## 2019-10-21 DIAGNOSIS — M25551 Pain in right hip: Secondary | ICD-10-CM

## 2019-10-21 DIAGNOSIS — M25651 Stiffness of right hip, not elsewhere classified: Secondary | ICD-10-CM

## 2019-10-21 NOTE — Therapy (Signed)
[facility] [place] [address] [place], [place], [address] Phone: [phone]   Fax:  [phone]  Physical Therapy Treatment  Patient Details  Name: Cody Huber MRN: [id number] Date of Birth: 1960-12-03 Referring Provider (PT): Doneen Poisson   Encounter Date: 10/21/2019  PT End of Session - 10/21/19 0943    Visit Number  5    Number of Visits  12    Date for PT Re-Evaluation  11/20/19    PT Start Time  0935    PT Stop Time  1023    PT Time Calculation (min)  48 min    Activity Tolerance  Patient tolerated treatment well    Behavior During Therapy  [facility] for tasks assessed/performed       Past Medical History:  Diagnosis Date  . Eczema   . Elevated cholesterol   . Hyperlipidemia   . Nasal congestion   . Right inguinal hernia     Past Surgical History:  Procedure Laterality Date  . HEMORRHOID SURGERY  2006 - approximate  . INGUINAL HERNIA REPAIR  07/24/2011   Procedure: LAPAROSCOPIC BILATERAL INGUINAL HERNIA REPAIR;  Surgeon: Ardeth Sportsman, MD;  Location: [facility];  Service: General;  Laterality: Bilateral;  . TOTAL HIP ARTHROPLASTY Right 09/18/2019   Procedure: RIGHT TOTAL HIP ARTHROPLASTY ANTERIOR APPROACH;  Surgeon: Kathryne Hitch, MD;  Location: [facility];  Service: Orthopedics;  Laterality: Right;    There were no vitals filed for this visit.  Subjective Assessment - 10/21/19 1044    Subjective  Pt reports his hip pain wakes him in the early hours of the morning; has to get up and walk around a little for it to subside.  He notices if he had his knees/hips flexed, this makes the discomfort in bed worse.    Limitations  Walking;Other (comment);House hold activities    How long can you walk comfortably?  500 feet    Patient Stated Goals  Pt has a big trip to [place] and would like to be able walk a mile.         [facility] PT Assessment - 10/21/19 0001      Assessment   Medical Diagnosis  Z96.641  (ICD-10-CM) - Status post total replacement of right hip    Referring Provider (PT)  Doneen Poisson    Onset Date/Surgical Date  09/18/19    Next MD Visit  10/29/19    Prior Therapy  HHPT for R THA      Flexibility   Quadriceps  Rt 105 deg, Lt 118 deg        [facility] Adult PT Treatment/Exercise - 10/21/19 0001      Ambulation/Gait   Ambulation Distance (Feet)  240 Feet    Assistive device  None    Gait Pattern  Step-through pattern;Decreased arm swing - left;Decreased arm swing - right;Decreased stance time - right;Decreased weight shift to right   Mild trunk L lateral lean   Gait Comments  tactile cues for reciprocal arm swing; cues for even weight shift.       Knee/Hip Exercises: Stretches   Passive Hamstring Stretch  Right;2 reps;20 seconds   seated; straight back   Quad Stretch  Right;3 reps;30 seconds    Piriformis Stretch  Right;3 reps;Left;1 rep;20 seconds    Piriformis Stretch Limitations  seated (challenging), moved to supine with greater ease.       Knee/Hip Exercises: Aerobic   Nustep  L5: 6.5 min (legs only)  Knee/Hip Exercises: Standing   SLS  Rt SLS with toe taps front,side, back x 8 reps       Knee/Hip Exercises: Seated   Sit to Sand  1 set;10 reps;with UE support;without UE support   with Lt foot forward to encourage Rt weight shift.      Knee/Hip Exercises: Supine   Bridges  1 set;10 reps    Bridges Limitations  cues to engage core; initially had increased back pain -resolved after 2 reps    Straight Leg Raises  Left;1 set;5 reps    Other Supine Knee/Hip Exercises  alternating hip flexion with core engaged x 10       Knee/Hip Exercises: Sidelying   Hip ABduction  Strengthening;Right;2 sets;10 reps      Modalities   Modalities  --   declined; pt will ice at home.              PT Short Term Goals - 10/09/19 1437      PT SHORT TERM GOAL #1   Title  Pt will be independent with HEP    Time  3    Period  Weeks    Status  New     Target Date  10/30/19        PT Long Term Goals - 10/21/19 1051      PT LONG TERM GOAL #1   Title  Pt will have normal reciprocal gait pattern with no a/d    Baseline  Antalgic R hip; asymmetrical step length, decreased R LE stance time with trendelenburg    Time  6    Period  Weeks    Status  On-going      PT LONG TERM GOAL #2   Title  Pt will have increased R hip strength to 5/5    Time  6    Period  Weeks    Status  On-going      PT LONG TERM GOAL #3   Title  Pt will have improved hamstring, TFL and ITB extensbility for decreased soreness    Time  6    Period  Weeks    Status  On-going      PT LONG TERM GOAL #4   Title  Pt will be able to tolerate walking a mile    Time  6    Period  Weeks    Status  On-going            Plan - 10/21/19 0959    Clinical Impression Statement  Pt initially had some back pain and Lt hamstring cramping with first 2 reps of bridge; resolved with rest.  Rt hip fatigues quickly with standing SLS exercises.  Pt able to tolerate all exercises well, with mild increase in hip discomfort.  Pt progressing towards all goals.    Personal Factors and Comorbidities  Age;Comorbidity 1    Comorbidities  Arthritis    Examination-Activity Limitations  Bed Mobility;Locomotion Level;Bend;Transfers;Lift    Examination-Participation Restrictions  Community Activity;Yard Work    Rehab Potential  Good    PT Frequency  2x / week    PT Duration  6 weeks    PT Treatment/Interventions  ADLs/Self Care Home Management;Cryotherapy;Electrical Stimulation;Iontophoresis 4mg /ml Dexamethasone;Moist Heat;Traction;Ultrasound;DME Instruction;Gait training;Stair training;Functional mobility training;Therapeutic activities;Therapeutic exercise;Balance training;Neuromuscular re-education;Patient/family education;Manual techniques;Scar mobilization;Passive range of motion;Dry needling;Taping;Vasopneumatic Device    PT Next Visit Plan  Strengthen and stretch Rt hip as tolerated.  Continue gait training. Manual therapy as indicated.    PT Home Exercise  Plan  [id number]    Consulted and Agree with Plan of Care  Patient       Patient will benefit from skilled therapeutic intervention in order to improve the following deficits and impairments:  Abnormal gait, Decreased coordination, Difficulty walking, Increased fascial restricitons, Decreased endurance, Decreased activity tolerance, Pain, Decreased balance, Hypomobility, Improper body mechanics, Decreased mobility, Decreased strength, Increased edema  Visit Diagnosis: Stiffness of right hip, not elsewhere classified  Pain in right hip  Muscle weakness (generalized)  Other abnormalities of gait and mobility     Problem List Patient Active Problem List   Diagnosis Date Noted  . Status post total replacement of right hip 09/18/2019  . Unilateral primary osteoarthritis, right hip 05/26/2019  . Abnormal glucose 06/19/2016  . Family history of aneurysm 06/09/2014  . AR (allergic rhinitis) 05/20/2012  . Obturator hernia, right 08/13/2011  . Bilateral inguinal hernia (R>>L) 07/10/2011  . Diastasis recti 07/10/2011  . Hyperlipemia 10/21/2008   Mayer Camel, PTA 10/21/19 10:52 AM  [facility] [address] [place], [place], [address] Phone: [phone]   Fax:  [phone]  Name: Cody Huber MRN: [id number] Date of Birth: 09-25-1960

## 2019-10-23 ENCOUNTER — Other Ambulatory Visit: Payer: Self-pay

## 2019-10-23 ENCOUNTER — Encounter: Payer: Self-pay | Admitting: Physical Therapy

## 2019-10-23 ENCOUNTER — Ambulatory Visit (INDEPENDENT_AMBULATORY_CARE_PROVIDER_SITE_OTHER): Payer: BC Managed Care – PPO | Admitting: Physical Therapy

## 2019-10-23 DIAGNOSIS — M25551 Pain in right hip: Secondary | ICD-10-CM | POA: Diagnosis not present

## 2019-10-23 DIAGNOSIS — M6281 Muscle weakness (generalized): Secondary | ICD-10-CM | POA: Diagnosis not present

## 2019-10-23 DIAGNOSIS — M25651 Stiffness of right hip, not elsewhere classified: Secondary | ICD-10-CM

## 2019-10-23 DIAGNOSIS — R2689 Other abnormalities of gait and mobility: Secondary | ICD-10-CM | POA: Diagnosis not present

## 2019-10-23 NOTE — Therapy (Signed)
[facility] [place] [address] [place], [place], [address] Phone: [phone]   Fax:  [phone]  Physical Therapy Treatment  Patient Details  Name: Cody Huber MRN: [id number] Date of Birth: July 20, 1960 Referring Provider (PT): Doneen Poisson   Encounter Date: 10/23/2019  PT End of Session - 10/23/19 1157    Visit Number  6    Number of Visits  12    Date for PT Re-Evaluation  11/20/19    PT Start Time  1149    PT Stop Time  1240    PT Time Calculation (min)  51 min    Activity Tolerance  Patient tolerated treatment well    Behavior During Therapy  [facility] for tasks assessed/performed       Past Medical History:  Diagnosis Date  . Eczema   . Elevated cholesterol   . Hyperlipidemia   . Nasal congestion   . Right inguinal hernia     Past Surgical History:  Procedure Laterality Date  . HEMORRHOID SURGERY  2006 - approximate  . INGUINAL HERNIA REPAIR  07/24/2011   Procedure: LAPAROSCOPIC BILATERAL INGUINAL HERNIA REPAIR;  Surgeon: Ardeth Sportsman, MD;  Location: [facility];  Service: General;  Laterality: Bilateral;  . TOTAL HIP ARTHROPLASTY Right 09/18/2019   Procedure: RIGHT TOTAL HIP ARTHROPLASTY ANTERIOR APPROACH;  Surgeon: Kathryne Hitch, MD;  Location: [facility];  Service: Orthopedics;  Laterality: Right;    There were no vitals filed for this visit.  Subjective Assessment - 10/23/19 1152    Subjective  Pt states he can tell he is getting better because he is able to get into his smaller car. Pt reports he is able to get up and down steps with reciprocal gait. Pt is mostly ambulating without his cane in the house except by the end of the night when he fatigues. Reports "dead spot" around scar tissue and continues to have some mild knee edema.    Limitations  Walking;Other (comment);House hold activities    How long can you walk comfortably?  500 feet    Patient Stated Goals  Pt has a big trip to [place]  and would like to be able walk a mile.                        [facility] Adult PT Treatment/Exercise - 10/23/19 0001      Ambulation/Gait   Ambulation Distance (Feet)  240 Feet    Assistive device  None    Gait Pattern  Step-through pattern;Decreased arm swing - right;Decreased stance time - right;Decreased weight shift to right    Gait Comments  tactile cues for reciprocal arm swing; cues for even weight shift.       Knee/Hip Exercises: Dentist  2 reps;30 seconds;Both    Piriformis Stretch  Both;30 seconds      Knee/Hip Exercises: Aerobic   Nustep  L5 x 6 min      Knee/Hip Exercises: Standing   Hip Flexion  Stengthening;10 reps   High knees   Hip Abduction  Both;Stengthening;10 reps    Hip Extension  Both;10 reps;Stengthening    Other Standing Knee Exercises  Sit<>stand x 10 eccentric returning to sitting      Manual Therapy   Manual Therapy  Taping    Manual therapy comments  fan taping along medial thigh for edema/bruising management             PT Education -  10/23/19 1257    Education Details  Reviewed HEP. Discussed scar tissue massage and tape.       PT Short Term Goals - 10/09/19 1437      PT SHORT TERM GOAL #1   Title  Pt will be independent with HEP    Time  3    Period  Weeks    Status  New    Target Date  10/30/19        PT Long Term Goals - 10/23/19 1255      PT LONG TERM GOAL #1   Title  Pt will have normal reciprocal gait pattern with no a/d    Baseline  Antalgic R hip; asymmetrical step length, decreased R LE stance time with trendelenburg    Time  6    Period  Weeks    Status  On-going      PT LONG TERM GOAL #2   Title  Pt will have increased R hip strength to 5/5    Time  6    Period  Weeks    Status  On-going      PT LONG TERM GOAL #3   Title  Pt will have improved hamstring, TFL and ITB extensbility for decreased soreness >/= 90 deg    Time  6    Period  Weeks    Status  Revised      PT LONG  TERM GOAL #4   Title  Pt will be able to tolerate walking a mile    Time  6    Period  Weeks    Status  On-going            Plan - 10/23/19 1252    Clinical Impression Statement  Pt continues to slowly strengthen right hip. Pt now able to perform sit->sup transfers without compensation. Pt with improving gait pattern/arm swing. Treatment focused on progressing pt's hip strengthening, stretching, and stabilization. Pt continues to have tight and tender IT bands. Taping provided for pt's continued edema/swelling along medial distal thigh.    Personal Factors and Comorbidities  Age;Comorbidity 1    Comorbidities  Arthritis    Examination-Activity Limitations  Bed Mobility;Locomotion Level;Bend;Transfers;Lift    Examination-Participation Restrictions  Community Activity;Yard Work    Rehab Potential  Good    PT Frequency  2x / week    PT Duration  6 weeks    PT Treatment/Interventions  ADLs/Self Care Home Management;Cryotherapy;Electrical Stimulation;Iontophoresis 4mg /ml Dexamethasone;Moist Heat;Traction;Ultrasound;DME Instruction;Gait training;Stair training;Functional mobility training;Therapeutic activities;Therapeutic exercise;Balance training;Neuromuscular re-education;Patient/family education;Manual techniques;Scar mobilization;Passive range of motion;Dry needling;Taping;Vasopneumatic Device    PT Next Visit Plan  Strengthen and stretch Rt hip as tolerated. Continue gait training. Manual therapy as indicated. Follow up about response to taping.    PT Home Exercise Plan     Consulted and Agree with Plan of Care  Patient       Patient will benefit from skilled therapeutic intervention in order to improve the following deficits and impairments:  Abnormal gait, Decreased coordination, Difficulty walking, Increased fascial restricitons, Decreased endurance, Decreased activity tolerance, Pain, Decreased balance, Hypomobility, Improper body mechanics, Decreased mobility, Decreased  strength, Increased edema  Visit Diagnosis: Stiffness of right hip, not elsewhere classified  Pain in right hip  Muscle weakness (generalized)  Other abnormalities of gait and mobility     Problem List Patient Active Problem List   Diagnosis Date Noted  . Status post total replacement of right hip 09/18/2019  . Unilateral primary osteoarthritis, right hip 05/26/2019  .  Abnormal glucose 06/19/2016  . Family history of aneurysm 06/09/2014  . AR (allergic rhinitis) 05/20/2012  . Obturator hernia, right 08/13/2011  . Bilateral inguinal hernia (R>>L) 07/10/2011  . Diastasis recti 07/10/2011  . Hyperlipemia 10/21/2008    Cody Huber April Gordy Levan 10/23/2019, 1:02 PM  [facility] [place], [place], [address] Phone: [phone]   Fax:  [phone]  Name: Cody Huber MRN: [id number] Date of Birth: 1961-04-16

## 2019-10-26 ENCOUNTER — Other Ambulatory Visit: Payer: Self-pay

## 2019-10-26 ENCOUNTER — Ambulatory Visit (INDEPENDENT_AMBULATORY_CARE_PROVIDER_SITE_OTHER): Payer: BC Managed Care – PPO | Admitting: Physical Therapy

## 2019-10-26 DIAGNOSIS — M25551 Pain in right hip: Secondary | ICD-10-CM | POA: Diagnosis not present

## 2019-10-26 DIAGNOSIS — R2689 Other abnormalities of gait and mobility: Secondary | ICD-10-CM

## 2019-10-26 DIAGNOSIS — M25651 Stiffness of right hip, not elsewhere classified: Secondary | ICD-10-CM

## 2019-10-26 DIAGNOSIS — M6281 Muscle weakness (generalized): Secondary | ICD-10-CM

## 2019-10-26 NOTE — Therapy (Signed)
[place], [place], [address] Phone: [phone]   Fax:  [phone]  Physical Therapy Treatment  Patient Details  Name: Cody Huber MRN: [id number] Date of Birth: November 30, 1960 Referring Provider (PT): Jean Rosenthal   Encounter Date: 10/26/2019  PT End of Session - 10/26/19 1202    Visit Number  7    Number of Visits  12    Date for PT Re-Evaluation  11/20/19    PT Start Time  1150    PT Stop Time  1231    PT Time Calculation (min)  41 min    Activity Tolerance  Patient tolerated treatment well    Behavior During Therapy  [facility] for tasks assessed/performed       Past Medical History:  Diagnosis Date  . Eczema   . Elevated cholesterol   . Hyperlipidemia   . Nasal congestion   . Right inguinal hernia     Past Surgical History:  Procedure Laterality Date  . HEMORRHOID SURGERY  2006 - approximate  . INGUINAL HERNIA REPAIR  07/24/2011   Procedure: LAPAROSCOPIC BILATERAL INGUINAL HERNIA REPAIR;  Surgeon: Adin Hector, MD;  Location: [facility];  Service: General;  Laterality: Bilateral;  . TOTAL HIP ARTHROPLASTY Right 09/18/2019   Procedure: RIGHT TOTAL HIP ARTHROPLASTY ANTERIOR APPROACH;  Surgeon: Mcarthur Rossetti, MD;  Location: [facility];  Service: Orthopedics;  Laterality: Right;    There were no vitals filed for this visit.  Subjective Assessment - 10/26/19 1200    Subjective  Pt states he didnt' sleep well last night.  The aching in his Rt lateral hip woke him up (6/10); had to get up and walk around to ease symptoms.  He has done his exercises this morning and is sore.  He is unsure if the Ktape helped.    Patient Stated Goals  Pt has a big trip to [place] and would like to be able walk a mile.    Currently in Pain?  Yes    Pain Score  3     Pain Location  Hip    Pain Orientation  Right    Pain Descriptors / Indicators  Sore    Aggravating Factors   increased activity    Pain Relieving Factors  walking, medicine         [facility] PT Assessment - 10/26/19 0001      Assessment   Medical Diagnosis  Z96.641 (ICD-10-CM) - Status post total replacement of right hip    Referring Provider (PT)  Jean Rosenthal    Onset Date/Surgical Date  09/18/19    Next MD Visit  10/29/19    Prior Therapy  HHPT for R THA      AROM   Right Hip Flexion  115      Strength   Right Hip Flexion  5/5    Right Hip Extension  4+/5    Right Hip External Rotation   5/5    Right Hip Internal Rotation  4/5    Right Hip ABduction  5/5    Right Knee Flexion  5/5        [facility] Adult PT Treatment/Exercise - 10/26/19 0001      Ambulation/Gait   Ambulation Distance (Feet)  160 Feet    Assistive device  None    Gait Pattern  Step-through pattern;Decreased arm swing - right;Decreased arm swing - left;Decreased stance time - right;Trendelenburg;Lateral trunk lean to right    Gait Comments  VC to keep back neutral during Rt stance,  VC to depress Lt shoulder and use reciprocal arm swing.       Knee/Hip Exercises: Stretches   Sports administrator  Right;1 rep;30 seconds    Hip Flexor Stretch  Right;2 reps;30 seconds   seated, leg off chair   Piriformis Stretch  Right;2 reps;20 seconds   supine, pulling knee to opp shoulder   Other Knee/Hip Stretches  butterfly stretch in supine x 30 sec x 2 reps      Knee/Hip Exercises: Aerobic   Nustep  L5: 5 min (legs only)       Knee/Hip Exercises: Prone   Hip Extension  Strengthening;Right;1 set;10 reps    Straight Leg Raises  Strengthening;Right;1 set;10 reps      Manual Therapy   Manual Therapy  Soft tissue mobilization    Soft tissue mobilization  IASTM and STM to Rt quad, ITB to decrease fascial restrictions and improve mobility     reviewed current HEP verbally and consolidated printed worksheets.    PT Short Term Goals - 10/26/19 1213      PT SHORT TERM GOAL #1   Title  Pt will be independent with HEP    Time  3    Period  Weeks     Status  On-going    Target Date  10/30/19        PT Long Term Goals - 10/26/19 1239      PT LONG TERM GOAL #1   Title  Pt will have normal reciprocal gait pattern with no a/d    Baseline  decreased R stance time, hiked Lt shoulder, Rt trunk lean.    Time  6    Period  Weeks    Status  On-going      PT LONG TERM GOAL #2   Title  Pt will have increased R hip strength to 5/5    Time  6    Period  Weeks    Status  Partially Met      PT LONG TERM GOAL #3   Title  Pt will have improved hamstring, TFL and ITB extensbility for decreased soreness >/= 90 deg    Time  6    Period  Weeks    Status  On-going      PT LONG TERM GOAL #4   Title  Pt will be able to tolerate walking a mile    Time  6    Period  Weeks    Status  On-going            Plan - 10/26/19 1243    Clinical Impression Statement  Pt's gait quality improving each visit; requires cues for even weight shift, decreased trunk lean, and scap depression on Lt. Pt demonstrated improved hip strength with MMT, however he still demonstrates weakness with functional movements.  He is making good progress towards goals: has partially met LTG #2.    Personal Factors and Comorbidities  Age;Comorbidity 1    Comorbidities  Arthritis    Examination-Activity Limitations  Bed Mobility;Locomotion Level;Bend;Transfers;Lift    Examination-Participation Restrictions  Community Activity;Yard Work    Rehab Potential  Good    PT Frequency  2x / week    PT Duration  6 weeks    PT Treatment/Interventions  ADLs/Self Care Home Management;Cryotherapy;Electrical Stimulation;Iontophoresis 67m/ml Dexamethasone;Moist Heat;Traction;Ultrasound;DME Instruction;Gait training;Stair training;Functional mobility training;Therapeutic activities;Therapeutic exercise;Balance training;Neuromuscular re-education;Patient/family education;Manual techniques;Scar mobilization;Passive range of motion;Dry needling;Taping;Vasopneumatic Device    PT Next Visit Plan  Strengthen and stretch Rt hip as tolerated. Continue gait training. Manual therapy as indicated.    PT Home Exercise Plan  [id number]    Consulted and Agree with Plan of Care  Patient       Patient will benefit from skilled therapeutic intervention in order to improve the following deficits and impairments:  Abnormal gait, Decreased coordination, Difficulty walking, Increased fascial restricitons, Decreased endurance, Decreased activity tolerance, Pain, Decreased balance, Hypomobility, Improper body mechanics, Decreased mobility, Decreased strength, Increased edema  Visit Diagnosis: Stiffness of right hip, not elsewhere classified  Pain in right hip  Muscle weakness (generalized)  Other abnormalities of gait and mobility     Problem List Patient Active Problem List   Diagnosis Date Noted  . Status post total replacement of right hip 09/18/2019  . Unilateral primary osteoarthritis, right hip 05/26/2019  . Abnormal glucose 06/19/2016  . Family history of aneurysm 06/09/2014  . AR (allergic rhinitis) 05/20/2012  . Obturator hernia, right 08/13/2011  . Bilateral inguinal hernia (R>>L) 07/10/2011  . Diastasis recti 07/10/2011  . Hyperlipemia 10/21/2008   Kerin Perna, PTA 10/26/19 12:53 PM  [place], [place], [address] Phone: [phone]   Fax:  [phone]  Name: BREANNA MCDANIEL MRN: [id number] Date of Birth: Aug 14, 1960

## 2019-10-28 ENCOUNTER — Ambulatory Visit (INDEPENDENT_AMBULATORY_CARE_PROVIDER_SITE_OTHER): Payer: BC Managed Care – PPO | Admitting: Physical Therapy

## 2019-10-28 ENCOUNTER — Other Ambulatory Visit: Payer: Self-pay

## 2019-10-28 DIAGNOSIS — M25551 Pain in right hip: Secondary | ICD-10-CM

## 2019-10-28 DIAGNOSIS — R2689 Other abnormalities of gait and mobility: Secondary | ICD-10-CM

## 2019-10-28 DIAGNOSIS — M25651 Stiffness of right hip, not elsewhere classified: Secondary | ICD-10-CM

## 2019-10-28 DIAGNOSIS — M6281 Muscle weakness (generalized): Secondary | ICD-10-CM | POA: Diagnosis not present

## 2019-10-28 NOTE — Therapy (Signed)
[place], [place], [address] Phone: [phone]   Fax:  [phone]  Physical Therapy Treatment  Patient Details  Name: Cody Huber MRN: [id number] Date of Birth: Jun 08, 1960 Referring Provider (PT): Jean Rosenthal   Encounter Date: 10/28/2019  PT End of Session - 10/28/19 1150    Visit Number  8    Number of Visits  12    Date for PT Re-Evaluation  11/20/19    PT Start Time  1660    PT Stop Time  1228    PT Time Calculation (min)  40 min    Activity Tolerance  Patient tolerated treatment well    Behavior During Therapy  [facility] for tasks assessed/performed       Past Medical History:  Diagnosis Date  . Eczema   . Elevated cholesterol   . Hyperlipidemia   . Nasal congestion   . Right inguinal hernia     Past Surgical History:  Procedure Laterality Date  . HEMORRHOID SURGERY  2006 - approximate  . INGUINAL HERNIA REPAIR  07/24/2011   Procedure: LAPAROSCOPIC BILATERAL INGUINAL HERNIA REPAIR;  Surgeon: Adin Hector, MD;  [facility]: [facility];  Service: General;  Laterality: Bilateral;  . TOTAL HIP ARTHROPLASTY Right 09/18/2019   Procedure: RIGHT TOTAL HIP ARTHROPLASTY ANTERIOR APPROACH;  Surgeon: Mcarthur Rossetti, MD;  Location: [facility];  Service: Orthopedics;  Laterality: Right;    There were no vitals filed for this visit.  Subjective Assessment - 10/28/19 1152    Subjective  Sleep continues to be challenging; awaking due to 5-6/10 pain.    Patient Stated Goals  Pt has a big trip to [place] and would like to be able walk a mile.    Currently in Pain?  Yes    Pain Score  1    only when walking; not at rest.   Pain Location  Hip    Pain Orientation  Right    Pain Descriptors / Indicators  Dull    Aggravating Factors   increased activity; after sleeping    Pain Relieving Factors  walking, medicine         [facility] PT Assessment - 10/28/19 0001      Assessment   Medical  Diagnosis  Z96.641 (ICD-10-CM) - Status post total replacement of right hip    Referring Provider (PT)  Jean Rosenthal    Onset Date/Surgical Date  09/18/19    Next MD Visit  10/29/19    Prior Therapy  HHPT for R THA      Flexibility   Hamstrings  60 deg RLE, 67 deg LLE.     Quadriceps  Rt 115 deg       Balance   Balance Assessed  Yes      [facility] Adult PT Treatment/Exercise - 10/28/19 0001      Knee/Hip Exercises: Stretches   Passive Hamstring Stretch  Right;2 reps;Left;1 rep;30 seconds    Quad Stretch  Right;2 reps;30 seconds   prone with strap   Hip Flexor Stretch  Right;2 reps;30 seconds   seated, leg off chair   Piriformis Stretch  Right;1 rep;30 seconds   trial of modified table pigeon pose   Piriformis Stretch Limitations  limited motion      Knee/Hip Exercises: Aerobic   Nustep  L5: 5.5 min (legs only)     Other Aerobic  single laps in between exercises to decrease stiffness in hip.  Cues to depress Lt shoulder and  increase Lt arm swing.       Knee/Hip Exercises: Standing   Lateral Step Up  Right;1 set;10 reps;Hand Hold: 1   onto Bosu    Lateral Step Up Limitations  with balance x 2-3 sec     Forward Step Up  Right;1 set;10 reps   onto Bosu   Step Down  Left;1 set;10 reps;Step Height: 6";Hand Hold: 1   and retro step up with RLE   SLS  Rt SLS x 8 sec, x 15 sec without UE support.   Rt SLS on Bosu x 10 sec with intermittent support to steady      Pt will ice upon returning home. Reviewed parameters.      PT Short Term Goals - 10/26/19 1213      PT SHORT TERM GOAL #1   Title  Pt will be independent with HEP    Time  3    Period  Weeks    Status  On-going    Target Date  10/30/19        PT Long Term Goals - 10/26/19 1239      PT LONG TERM GOAL #1   Title  Pt will have normal reciprocal gait pattern with no a/d    Baseline  decreased R stance time, hiked Lt shoulder, Rt trunk lean.    Time  6    Period  Weeks    Status  On-going      PT LONG  TERM GOAL #2   Title  Pt will have increased R hip strength to 5/5    Time  6    Period  Weeks    Status  Partially Met      PT LONG TERM GOAL #3   Title  Pt will have improved hamstring, TFL and ITB extensbility for decreased soreness >/= 90 deg    Time  6    Period  Weeks    Status  On-going      PT LONG TERM GOAL #4   Title  Pt will be able to tolerate walking a mile    Time  6    Period  Weeks    Status  On-going            Plan - 10/28/19 1236    Clinical Impression Statement  Pt's Rt quad flexibility has improved over last week.  Rt hamstring 8 deg tighter than Lt.  Pt tolerated exercises well, reporting some increased stiffness and mild discomfort upon completion; eased with walking laps in between exercises.  Pt making good gains each visit towards goals.    Personal Factors and Comorbidities  Age;Comorbidity 1    Comorbidities  Arthritis    Examination-Activity Limitations  Bed Mobility;Locomotion Level;Bend;Transfers;Lift    Examination-Participation Restrictions  Community Activity;Yard Work    Rehab Potential  Good    PT Frequency  2x / week    PT Duration  6 weeks    PT Treatment/Interventions  ADLs/Self Care Home Management;Cryotherapy;Electrical Stimulation;Iontophoresis 29m/ml Dexamethasone;Moist Heat;Traction;Ultrasound;DME Instruction;Gait training;Stair training;Functional mobility training;Therapeutic activities;Therapeutic exercise;Balance training;Neuromuscular re-education;Patient/family education;Manual techniques;Scar mobilization;Passive range of motion;Dry needling;Taping;Vasopneumatic Device    PT Next Visit Plan  increase height of step ups; trial of incline on treadmill, squats (assist with return to hiking)- pt going on vacation next week.     PT Home Exercise Plan  [email or web address]   Consulted and Agree with Plan of Care  Patient       Patient will benefit from skilled therapeutic intervention  in order to improve the following deficits and  impairments:  Abnormal gait, Decreased coordination, Difficulty walking, Increased fascial restricitons, Decreased endurance, Decreased activity tolerance, Pain, Decreased balance, Hypomobility, Improper body mechanics, Decreased mobility, Decreased strength, Increased edema  Visit Diagnosis: Stiffness of right hip, not elsewhere classified  Pain in right hip  Muscle weakness (generalized)  Other abnormalities of gait and mobility     Problem List Patient Active Problem List   Diagnosis Date Noted  . Status post total replacement of right hip 09/18/2019  . Unilateral primary osteoarthritis, right hip 05/26/2019  . Abnormal glucose 06/19/2016  . Family history of aneurysm 06/09/2014  . AR (allergic rhinitis) 05/20/2012  . Obturator hernia, right 08/13/2011  . Bilateral inguinal hernia (R>>L) 07/10/2011  . Diastasis recti 07/10/2011  . Hyperlipemia 10/21/2008   Kerin Perna, PTA 10/28/19 12:47 PM  [place], [place], [address] Phone: [phone]   Fax:  [phone]  Name: Cody Huber MRN: [id number] Date of Birth: [birth year]

## 2019-10-29 ENCOUNTER — Ambulatory Visit (INDEPENDENT_AMBULATORY_CARE_PROVIDER_SITE_OTHER): Payer: BC Managed Care – PPO | Admitting: Orthopaedic Surgery

## 2019-10-29 ENCOUNTER — Encounter: Payer: Self-pay | Admitting: Orthopaedic Surgery

## 2019-10-29 DIAGNOSIS — Z96641 Presence of right artificial hip joint: Secondary | ICD-10-CM

## 2019-10-29 NOTE — Progress Notes (Signed)
The patient is now 6 weeks status post a right total hip arthroplasty.  He is having some trouble sleeping at night but overall he is doing well.  He does wake up about 3 or 4:00 in the morning and has a walk around to get his pain to calm down.  He is not taking a narcotic.  On examination his right operative hip moves smoothly and fluidly.  There is no significant stiffness at all.  He does still walk with a limp when he first gets up and walks self easily.  I gave him reassurance that the sleep will improve with time.  He can try 600 to 800 mg of ibuprofen at night as well as some Benadryl or melatonin.  From my standpoint, I do not need to see him back for 6 months unless he is having issues.  At that visit I like a standing AP pelvis as well as and a lateral of his right operative hip.

## 2019-11-02 ENCOUNTER — Ambulatory Visit (INDEPENDENT_AMBULATORY_CARE_PROVIDER_SITE_OTHER): Payer: BC Managed Care – PPO | Admitting: Physical Therapy

## 2019-11-02 ENCOUNTER — Other Ambulatory Visit: Payer: Self-pay

## 2019-11-02 DIAGNOSIS — M25651 Stiffness of right hip, not elsewhere classified: Secondary | ICD-10-CM | POA: Diagnosis not present

## 2019-11-02 DIAGNOSIS — M25551 Pain in right hip: Secondary | ICD-10-CM

## 2019-11-02 DIAGNOSIS — R2689 Other abnormalities of gait and mobility: Secondary | ICD-10-CM | POA: Diagnosis not present

## 2019-11-02 DIAGNOSIS — M6281 Muscle weakness (generalized): Secondary | ICD-10-CM

## 2019-11-02 NOTE — Patient Instructions (Signed)
Access Code: [email or web address]: 06/14/2021Prepared by: [facility] [place]Exercises  Sit to Stand without Arm Support - 1 x daily - 3 x weekly - 1 sets - 10 reps  Hip Extension with Resistance Loop - 1 x daily - 3 x weekly - 2 sets - 10 reps  Hip Abduction with Resistance Loop - 1 x daily - 3 x weekly - 2 sets - 10 reps  Supine Active Straight Leg Raise - 1 x daily - 3 x weekly - 1-2 sets - 5-10 reps  Supine Bridge with Mini Swiss Ball Between Knees - 1 x daily - 3 x weekly - 3 sets - 10 reps  Supine Piriformis Stretch with Foot on Ground - 2 x daily - 7 x weekly - 1 sets - 2-3 reps - 20 hold  Hooklying Hamstring Stretch with Strap - 2 x daily - 7 x weekly - 1 sets - 10 reps - 20 hold  Prone Quadriceps Stretch with Strap - 2 x daily - 7 x weekly - 1 sets - 2-3 reps - 20 hold  Seated Hip Flexor Stretch - 2 x daily - 7 x weekly - 1 sets - 2 reps - 20 hold  Supine Butterfly Groin Stretch - 2 x daily - 7 x weekly - 1 sets - 2 reps - 20 hold

## 2019-11-02 NOTE — Therapy (Addendum)
[place] [facility] [place], [place], [address] Phone: [phone]   Fax:  [phone]  Physical Therapy Treatment and Discharge  Patient Details  Name: Cody Huber MRN: [id number] Date of Birth: [birth year] Referring Provider (PT): Jean Rosenthal   Encounter Date: 11/02/2019   PT End of Session - 11/02/19 1607    Visit Number 9    Number of Visits 12    Date for PT Re-Evaluation 11/20/19    PT Start Time 3159    PT Stop Time 1558    PT Time Calculation (min) 43 min    Activity Tolerance Patient tolerated treatment well    Behavior During Therapy [facility] for tasks assessed/performed           Past Medical History:  Diagnosis Date  . Eczema   . Elevated cholesterol   . Hyperlipidemia   . Nasal congestion   . Right inguinal hernia     Past Surgical History:  Procedure Laterality Date  . HEMORRHOID SURGERY  2006 - approximate  . INGUINAL HERNIA REPAIR  07/24/2011   Procedure: LAPAROSCOPIC BILATERAL INGUINAL HERNIA REPAIR;  Surgeon: Adin Hector, MD;  Location: [facility];  Service: General;  Laterality: Bilateral;  . TOTAL HIP ARTHROPLASTY Right 09/18/2019   Procedure: RIGHT TOTAL HIP ARTHROPLASTY ANTERIOR APPROACH;  Surgeon: Mcarthur Rossetti, MD;  Location: [facility];  Service: Orthopedics;  Laterality: Right;    There were no vitals filed for this visit.   Subjective Assessment - 11/02/19 1517    Subjective Pt reports he visited Dr last week.  He was told sleep disturbance is normal.  He is leaving on vacation Thursday; is unsure if he wants to come back to therapy upon his return.    Currently in Pain? Yes    Pain Score 2     Pain Location Hip    Pain Orientation Right;Anterior    Pain Descriptors / Indicators Aching;Sore              [facility] PT Assessment - 11/02/19 0001      Assessment   Medical Diagnosis Z96.641 (ICD-10-CM) - Status post total replacement of right hip    Referring  Provider (PT) Jean Rosenthal    Onset Date/Surgical Date 09/18/19    Next MD Visit 04/2020    Prior Therapy [facility] for R THA            [place] [facility] PT Treatment/Exercise - 11/02/19 0001      Knee/Hip Exercises: Stretches   Passive Hamstring Stretch Right;2 reps;Left;1 rep;30 seconds    Quad Stretch Right;2 reps;30 seconds   prone with strap   Hip Flexor Stretch Right;2 reps;30 seconds   seated, leg off chair   Other Knee/Hip Stretches reviewed butterfly and piriformis verbally      Knee/Hip Exercises: Aerobic   Nustep L5: 5.5 min (legs only)     Other Aerobic single laps in between exercises to decrease stiffness in hip.  Cues to depress Lt shoulder and increase Lt arm swing.       Knee/Hip Exercises: Standing   Abduction Limitations side stepping with red band at ankles x 15 ft R/Lt without UE support     Hip Extension Right;Left;1 set;10 reps   red band at ankles   SLS Rt SLS x 30     Other Standing Knee Exercises  Rt tandem stance with horiz /vertical head turns x 60 sec       Knee/Hip Exercises: Seated  Sit to Sand without UE support   8 reps     Knee/Hip Exercises: Supine   Bridges 1 set;10 reps   5 sec hold, ball squeeze     Knee/Hip Exercises: Sidelying   Clams 10 reps, RLE with red band.                   PT Education - 11/02/19 1609    Education Details HEP. red band issued.    Person(s) Educated Patient    Methods Explanation;Handout;Demonstration;Verbal cues    Comprehension Verbalized understanding;Returned demonstration            PT Short Term Goals - 10/26/19 1213      PT SHORT TERM GOAL #1   Title Pt will be independent with HEP    Time 3    Period Weeks    Status On-going    Target Date 10/30/19             PT Long Term Goals - 10/26/19 1239      PT LONG TERM GOAL #1   Title Pt will have normal reciprocal gait pattern with no a/d    Baseline decreased R stance time, hiked Lt shoulder, Rt trunk lean.    Time 6    Period  Weeks    Status On-going      PT LONG TERM GOAL #2   Title Pt will have increased R hip strength to 5/5    Time 6    Period Weeks    Status Partially Met      PT LONG TERM GOAL #3   Title Pt will have improved hamstring, TFL and ITB extensbility for decreased soreness >/= 90 deg    Time 6    Period Weeks    Status On-going      PT LONG TERM GOAL #4   Title Pt will be able to tolerate walking a mile    Time 6    Period Weeks    Status On-going                 Plan - 11/02/19 1530    Clinical Impression Statement Pt demonstrated improved balance in Rt SLS.  Pt hasn't trialed walking a mile yet.  Pt had mild increase in discomfort in standing hip ext exercises; reduced with rest and stretches.  HEP updated. Pt is making gradual gains towards LTGs; will benefit from additional sessions to improve functional mobility.    Personal Factors and Comorbidities Age;Comorbidity 1    Comorbidities Arthritis    Examination-Activity Limitations Bed Mobility;Locomotion Level;Bend;Transfers;Lift    Examination-Participation Restrictions Community Activity;Yard Work    Rehab Potential Good    PT Frequency 2x / week    PT Duration 6 weeks    PT Treatment/Interventions ADLs/Self Care Home Management;Cryotherapy;Electrical Stimulation;Iontophoresis 15m/ml Dexamethasone;Moist Heat;Traction;Ultrasound;DME Instruction;Gait training;Stair training;Functional mobility training;Therapeutic activities;Therapeutic exercise;Balance training;Neuromuscular re-education;Patient/family education;Manual techniques;Scar mobilization;Passive range of motion;Dry needling;Taping;Vasopneumatic Device    PT Next Visit Plan Strengthen and stretch Rt hip as tolerated. Continue gait training. Manual therapy as indicated.    PT Home Exercise Plan [id number]   Consulted and Agree with Plan of Care Patient           PHYSICAL THERAPY DISCHARGE SUMMARY  Visits from Start of Care: 9  Current functional level  related to goals / functional outcomes: Improving balance and gait pattern. Decreased pain. Hip strength improving   Remaining deficits: Some continued gait pattern deficits, has not trialed walking a mile,  continues to be stiff.    Education / Equipment: HEP updated on last session and provided red theraband.   Plan:                                                    Patient goals were partially met. Patient is being discharged due to not returning since the last visit.  ?????  Pt unsure of returning to PT after his vacation.       Patient will benefit from skilled therapeutic intervention in order to improve the following deficits and impairments:  Abnormal gait, Decreased coordination, Difficulty walking, Increased fascial restricitons, Decreased endurance, Decreased activity tolerance, Pain, Decreased balance, Hypomobility, Improper body mechanics, Decreased mobility, Decreased strength, Increased edema  Visit Diagnosis: Stiffness of right hip, not elsewhere classified  Pain in right hip  Muscle weakness (generalized)  Other abnormalities of gait and mobility     Problem List Patient Active Problem List   Diagnosis Date Noted  . Status post total replacement of right hip 09/18/2019  . Unilateral primary osteoarthritis, right hip 05/26/2019  . Abnormal glucose 06/19/2016  . Family history of aneurysm 06/09/2014  . AR (allergic rhinitis) 05/20/2012  . Obturator hernia, right 08/13/2011  . Bilateral inguinal hernia (R>>L) 07/10/2011  . Diastasis recti 07/10/2011  . Hyperlipemia 10/21/2008   [facility] April Gordy Levan, PT, DPT 12/03/2019 8:36 AM  Kerin Perna, PTA 11/02/19 4:20 PM  [facility] [place], [place], [address] Phone: [phone]   Fax:  [phone]  Name: DEMETRIO LEIGHTY MRN: [id number] Date of Birth: 1961-02-28

## 2020-02-24 ENCOUNTER — Ambulatory Visit (INDEPENDENT_AMBULATORY_CARE_PROVIDER_SITE_OTHER): Payer: BC Managed Care – PPO | Admitting: Orthopaedic Surgery

## 2020-02-24 ENCOUNTER — Encounter: Payer: Self-pay | Admitting: Orthopaedic Surgery

## 2020-02-24 ENCOUNTER — Ambulatory Visit: Payer: Self-pay

## 2020-02-24 DIAGNOSIS — M25562 Pain in left knee: Secondary | ICD-10-CM | POA: Diagnosis not present

## 2020-02-24 MED ORDER — METHYLPREDNISOLONE ACETATE 40 MG/ML IJ SUSP
40.0000 mg | INTRAMUSCULAR | Status: AC | PRN
  Administered 2020-02-24: 40 mg via INTRA_ARTICULAR

## 2020-02-24 MED ORDER — LIDOCAINE HCL 1 % IJ SOLN
3.0000 mL | INTRAMUSCULAR | Status: AC | PRN
  Administered 2020-02-24: 3 mL

## 2020-02-24 NOTE — Progress Notes (Signed)
Office Visit Note   Patient: Cody Huber           Date of Birth: 17-Feb-1961           MRN: [id number] Visit Date: 02/24/2020              Requested by: Agapito Games, MD [id number] [place],  [place] [address] PCP: Agapito Games, MD   Assessment & Plan: Visit Diagnoses:  1. Left knee pain, unspecified chronicity     Plan: We will see him back in December for his follow-up appointment of his right total hip arthroplasty.  At that appointment low normal discuss his knees again.  Discussed quad strengthening with him and knee friendly exercises.  Questions were encouraged and answered by Dr. Magnus Ivan and myself.  Follow-Up Instructions: Return Has appointment in December.   Orders:  Orders Placed This Encounter  Procedures  . Large Joint Inj  . XR Knee 1-2 Views Left   No orders of the defined types were placed in this encounter.     Procedures: Large Joint Inj: L knee on 02/24/2020 10:15 AM Indications: pain Details: 22 G 1.5 in needle, anterolateral approach  Arthrogram: No  Medications: 3 mL lidocaine 1 %; 40 mg methylPREDNISolone acetate 40 MG/ML Outcome: tolerated well, no immediate complications Procedure, treatment alternatives, risks and benefits explained, specific risks discussed. Consent was given by the patient. Immediately prior to procedure a time out was called to verify the correct patient, procedure, equipment, support staff and site/side marked as required. Patient was prepped and draped in the usual sterile fashion.       Clinical Data: No additional findings.   Subjective: Chief Complaint  Patient presents with  . Left Knee - Pain    HPI Patient is a 59 year old male well-known to Dr. Magnus Ivan service with a history of right total hip arthroplasty 09/18/2019.  He comes in today with new onset of left knee pain for about a month.  No known injury.  Denies any mechanical symptoms.  Denies any swelling.  He  states the knee is stiff when he first gets up from his being seated.  He has had some constant pain in the knee mostly medial aspect of the knee for the last month.  He has tried ice, over-the-counter NSAIDs and mobile with some relief.  He is using no assistive device to ambulate.  Review of Systems  Constitutional: Negative for chills and fever.  Respiratory: Negative for shortness of breath.   Cardiovascular: Negative for chest pain.     Objective: Vital Signs: There were no vitals taken for this visit.  Physical Exam Constitutional:      Appearance: He is not ill-appearing or diaphoretic.  Pulmonary:     Effort: Pulmonary effort is normal.  Neurological:     Mental Status: He is alert and oriented to person, place, and time.  Psychiatric:        Mood and Affect: Mood normal.     Ortho Exam Bilateral knees good range of motion.  No instability valgus varus stressing.  Tenderness along the medial joint line.  No effusion abnormal warmth erythema. Specialty Comments:  No specialty comments available.  Imaging: XR Knee 1-2 Views Left  Result Date: 02/24/2020 Left knee 2 views: Shows moderate patellofemoral changes and mild medial compartmental changes.  Chronic Osgood Slaughter changes noted.  No acute fractures.    PMFS History: Patient Active Problem List   Diagnosis Date Noted  .  Status post total replacement of right hip 09/18/2019  . Unilateral primary osteoarthritis, right hip 05/26/2019  . Abnormal glucose 06/19/2016  . Family history of aneurysm 06/09/2014  . AR (allergic rhinitis) 05/20/2012  . Obturator hernia, right 08/13/2011  . Bilateral inguinal hernia (R>>L) 07/10/2011  . Diastasis recti 07/10/2011  . Hyperlipemia 10/21/2008   Past Medical History:  Diagnosis Date  . Eczema   . Elevated cholesterol   . Hyperlipidemia   . Nasal congestion   . Right inguinal hernia     Family History  Problem Relation Age of Onset  . Diabetes Father   .  Melanoma Father   . Hyperlipidemia Father   . Heart disease Father 39  . Hypertension Mother   . Aneurysm Mother 71    Past Surgical History:  Procedure Laterality Date  . HEMORRHOID SURGERY  2006 - approximate  . INGUINAL HERNIA REPAIR  07/24/2011   Procedure: LAPAROSCOPIC BILATERAL INGUINAL HERNIA REPAIR;  Surgeon: Ardeth Sportsman, MD;  Location: [facility];  Service: General;  Laterality: Bilateral;  . TOTAL HIP ARTHROPLASTY Right 09/18/2019   Procedure: RIGHT TOTAL HIP ARTHROPLASTY ANTERIOR APPROACH;  Surgeon: Kathryne Hitch, MD;  Location: [facility];  Service: Orthopedics;  Laterality: Right;   Social History   Occupational History  . Occupation: Asst Manager    Comment: [employer]  Tobacco Use  . Smoking status: Former Smoker    Quit date: 05/21/1984    Years since quitting: 35.7  . Smokeless tobacco: Never Used  Vaping Use  . Vaping Use: Never used  Substance and Sexual Activity  . Alcohol use: Yes    Alcohol/week: 2.0 standard drinks    Types: 2 Standard drinks or equivalent per week    Comment: 5-6 beers per week  . Drug use: No  . Sexual activity: Yes    Partners: Female

## 2020-02-25 ENCOUNTER — Encounter: Payer: Self-pay | Admitting: Orthopaedic Surgery

## 2020-04-28 ENCOUNTER — Encounter: Payer: Self-pay | Admitting: Orthopaedic Surgery

## 2020-04-28 ENCOUNTER — Ambulatory Visit (INDEPENDENT_AMBULATORY_CARE_PROVIDER_SITE_OTHER): Payer: BC Managed Care – PPO | Admitting: Orthopaedic Surgery

## 2020-04-28 ENCOUNTER — Ambulatory Visit: Payer: Self-pay

## 2020-04-28 DIAGNOSIS — Z96641 Presence of right artificial hip joint: Secondary | ICD-10-CM | POA: Diagnosis not present

## 2020-04-28 NOTE — Progress Notes (Signed)
The patient is now 8 months status post a right total hip arthroplasty.  He is doing well overall and has good hip motion and strength.  I have seen him for his left knee before.  He is taking turmeric for this and doing well.  He really has no complaints.  He is walking with a normal gait and without an assistive device.  On exam he has smooth range of motion of his right operative hip with no difficulty at all.  His left hip also moves smoothly with just touch stiffness.  An AP pelvis and lateral of the right hip shows a well-seated total hip arthroplasty with no complicating features.  There is mild arthritic changes of the left hip.  At this point follow-up can be as needed since he is doing well.  He knows that there is any issues at all not to hesitate to let us know or give [place] a call.  All questions and concerns were answered and addressed.

## 2020-05-06 ENCOUNTER — Other Ambulatory Visit: Payer: Self-pay

## 2020-05-06 ENCOUNTER — Encounter: Payer: Self-pay | Admitting: Physician Assistant

## 2020-05-06 ENCOUNTER — Ambulatory Visit (INDEPENDENT_AMBULATORY_CARE_PROVIDER_SITE_OTHER): Payer: BC Managed Care – PPO | Admitting: Physician Assistant

## 2020-05-06 VITALS — BP 136/85 | HR 86 | Ht 71.0 in | Wt 226.0 lb

## 2020-05-06 DIAGNOSIS — R252 Cramp and spasm: Secondary | ICD-10-CM | POA: Diagnosis not present

## 2020-05-06 DIAGNOSIS — M79604 Pain in right leg: Secondary | ICD-10-CM | POA: Diagnosis not present

## 2020-05-06 MED ORDER — DICLOFENAC SODIUM 1 % EX GEL: 4.0000 g | Freq: Four times a day (QID) | CUTANEOUS | 1 refills | Status: AC

## 2020-05-06 NOTE — Progress Notes (Signed)
   Subjective:    Patient ID: Cody Huber, male    DOB: 1960-12-27, 59 y.o.   MRN: [id number]  HPI  Pt is a 59 yo male who presents to the clinic with right calf and thigh cramping and pain. He did have right hip replacement in 08/2019 but has recovered well. He just had a follow up with ortho 12/9 with a great report. His pain occurs during the night or first thing in the morning. Pain seems to start in calf and radiate up into thigh. At times he feels like he cannot straighten it.  No injury or trauma. Seems to be better throughout the day. Started just after Thanksgiving. Denies any numbness and tingling. Denies any low back pain.    .. Active Ambulatory Problems    Diagnosis Date Noted  . Hyperlipemia 10/21/2008  . Bilateral inguinal hernia (R>>L) 07/10/2011  . Diastasis recti 07/10/2011  . Obturator hernia, right 08/13/2011  . AR (allergic rhinitis) 05/20/2012  . Family history of aneurysm 06/09/2014  . Abnormal glucose 06/19/2016  . Unilateral primary osteoarthritis, right hip 05/26/2019  . Status post total replacement of right hip 09/18/2019  . Right leg pain 05/09/2020  . Leg cramps 05/09/2020   Resolved Ambulatory Problems    Diagnosis Date Noted  . Preventive measure 02/25/2014   Past Medical History:  Diagnosis Date  . Eczema   . Elevated cholesterol   . Hyperlipidemia   . Nasal congestion   . Right inguinal hernia        Review of Systems  All other systems reviewed and are negative.      Objective:   Physical Exam Vitals reviewed.  Constitutional:      Appearance: Normal appearance.  Cardiovascular:     Rate and Rhythm: Normal rate and regular rhythm.     Pulses: Normal pulses.     Heart sounds: Normal heart sounds.  Pulmonary:     Effort: Pulmonary effort is normal.     Breath sounds: Normal breath sounds.  Musculoskeletal:     Comments: Normal flexion at waist No tenderness over lumbar spine to palpation or over greater trochanter. Normal  ROM at knee and hip.  Tightness with SLR to the right.  No tenderness over achilles.  No bruising, redness, warm over calf.   Neurological:     General: No focal deficit present.     Mental Status: He is alert.     Gait: Gait normal.  Psychiatric:        Mood and Affect: Mood normal.           Assessment & Plan:  Marland KitchenMarland KitchenDiagnoses and all orders for this visit:  Leg cramps -     diclofenac Sodium (VOLTAREN) 1 % GEL; Apply 4 g topically 4 (four) times daily. To affected joint.  Right leg pain -     diclofenac Sodium (VOLTAREN) 1 % GEL; Apply 4 g topically 4 (four) times daily. To affected joint.   No risk factors or PE signs of DVT. Symptoms worse at night and in the morning and better throughout the day. No signs of radicular pain. Pt does have hx of hip replacement in spring of 2021. I suspect musculoskeletal cramping or tightness. Make sure you are drinking enough.  Continue tumeric, start voltaren gel, ice and massage, start magnesium at bedtime. Exercises given to focus on calves and hamstrings. Could need formal PT.  Follow up as needed or if not improving.

## 2020-05-06 NOTE — Patient Instructions (Addendum)
Magnesium 250-500mg  daily.  Diclofenac gel up to 4 times a day.  Start stretches. Use ice or heat.     Leg Cramps Leg cramps occur when one or more muscles tighten and you have no control over this tightening (involuntary muscle contraction). Muscle cramps can develop in any muscle, but the most common place is in the calf muscles of the leg. Those cramps can occur during exercise or when you are at rest. Leg cramps are painful, and they may last for a few seconds to a few minutes. Cramps may return several times before they finally stop. Usually, leg cramps are not caused by a serious medical problem. In many cases, the cause is not known. Some common causes include:  Excessive physical effort (overexertion), such as during intense exercise.  Overuse from repetitive motions, or doing the same thing over and over.  Staying in a certain position for a long period of time.  Improper preparation, form, or technique while performing a sport or an activity.  Dehydration.  Injury.  Side effects of certain medicines.  Abnormally low levels of minerals in your blood (electrolytes), especially potassium and calcium. This could result from: ? Pregnancy. ? Taking diuretic medicines. Follow these instructions at home: Eating and drinking  Drink enough fluid to keep your urine pale yellow. Staying hydrated may help prevent cramps.  Eat a healthy diet that includes plenty of nutrients to help your muscles function. A healthy diet includes fruits and vegetables, lean protein, whole grains, and low-fat or nonfat dairy products. Managing pain, stiffness, and swelling      Try massaging, stretching, and relaxing the affected muscle. Do this for several minutes at a time.  If directed, put ice on areas that are sore or painful after a cramp: ? Put ice in a plastic bag. ? Place a towel between your skin and the bag. ? Leave the ice on for 20 minutes, 2-3 times a day.  If directed, apply heat  to muscles that are tense or tight. Do this before you exercise, or as often as told by your health care provider. Use the heat source that your health care provider recommends, such as a moist heat pack or a heating pad. ? Place a towel between your skin and the heat source. ? Leave the heat on for 20-30 minutes. ? Remove the heat if your skin turns bright red. This is especially important if you are unable to feel pain, heat, or cold. You may have a greater risk of getting burned.  Try taking hot showers or baths to help relax tight muscles. General instructions  If you are having frequent leg cramps, avoid intense exercise for several days.  Take over-the-counter and prescription medicines only as told by your health care provider.  Keep all follow-up visits as told by your health care provider. This is important. Contact a health care provider if:  Your leg cramps get more severe or more frequent, or they do not improve over time.  Your foot becomes cold, numb, or blue. Summary  Muscle cramps can develop in any muscle, but the most common place is in the calf muscles of the leg.  Leg cramps are painful, and they may last for a few seconds to a few minutes.  Usually, leg cramps are not caused by a serious medical problem. Often, the cause is not known.  Stay hydrated and take over-the-counter and prescription medicines only as told by your health care provider. This information is not intended  to replace advice given to you by your health care provider. Make sure you discuss any questions you have with your health care provider. Document Revised: 04/19/2017 Document Reviewed: 02/14/2017 Elsevier Patient Education  2020 [employer].

## 2020-05-09 ENCOUNTER — Encounter: Payer: Self-pay | Admitting: Physician Assistant

## 2020-05-09 ENCOUNTER — Telehealth: Payer: Self-pay | Admitting: Physician Assistant

## 2020-05-09 DIAGNOSIS — M79604 Pain in right leg: Secondary | ICD-10-CM | POA: Insufficient documentation

## 2020-05-09 DIAGNOSIS — R252 Cramp and spasm: Secondary | ICD-10-CM | POA: Insufficient documentation

## 2020-05-09 NOTE — Telephone Encounter (Signed)
Received fax for PA on Diclofenac Sodium sent through cover my meds waiting on determination - CF °

## 2020-05-26 NOTE — Telephone Encounter (Signed)
Checked status of medication on cover my meds and it was denied. - CF

## 2020-06-01 ENCOUNTER — Encounter: Payer: Self-pay | Admitting: Family Medicine

## 2020-06-01 NOTE — Telephone Encounter (Signed)
Do you have a PCP suggestion?

## 2020-06-03 ENCOUNTER — Other Ambulatory Visit: Payer: Self-pay | Admitting: Family Medicine

## 2020-06-03 DIAGNOSIS — E785 Hyperlipidemia, unspecified: Secondary | ICD-10-CM

## 2020-06-29 ENCOUNTER — Other Ambulatory Visit: Payer: Self-pay | Admitting: Family Medicine

## 2020-06-29 DIAGNOSIS — E785 Hyperlipidemia, unspecified: Secondary | ICD-10-CM

## 2020-07-13 ENCOUNTER — Encounter: Payer: BC Managed Care – PPO | Admitting: Family Medicine

## 2020-12-22 IMAGING — RF DG HIP (WITH PELVIS) OPERATIVE*R*
1 series · 5 of 5 positions shown · non-contrast
Comparison: 05/26/2019

CLINICAL DATA: Right hip replacement

EXAM:
OPERATIVE RIGHT HIP (WITH PELVIS IF PERFORMED) 1 VIEWS
TECHNIQUE: Fluoroscopic spot image(s) were submitted for interpretation
post-operatively.

[Series 1: unknown protocol · 0.20mm/px · 5 of 5 slices shown]
[im 1/5]
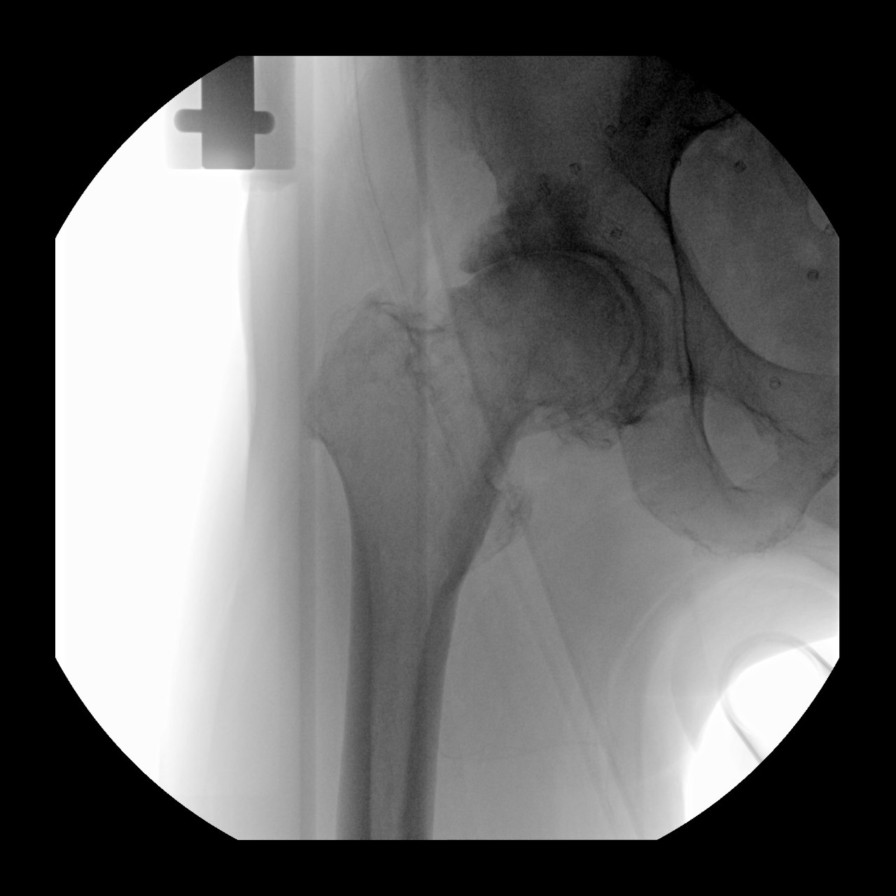
[im 2/5]
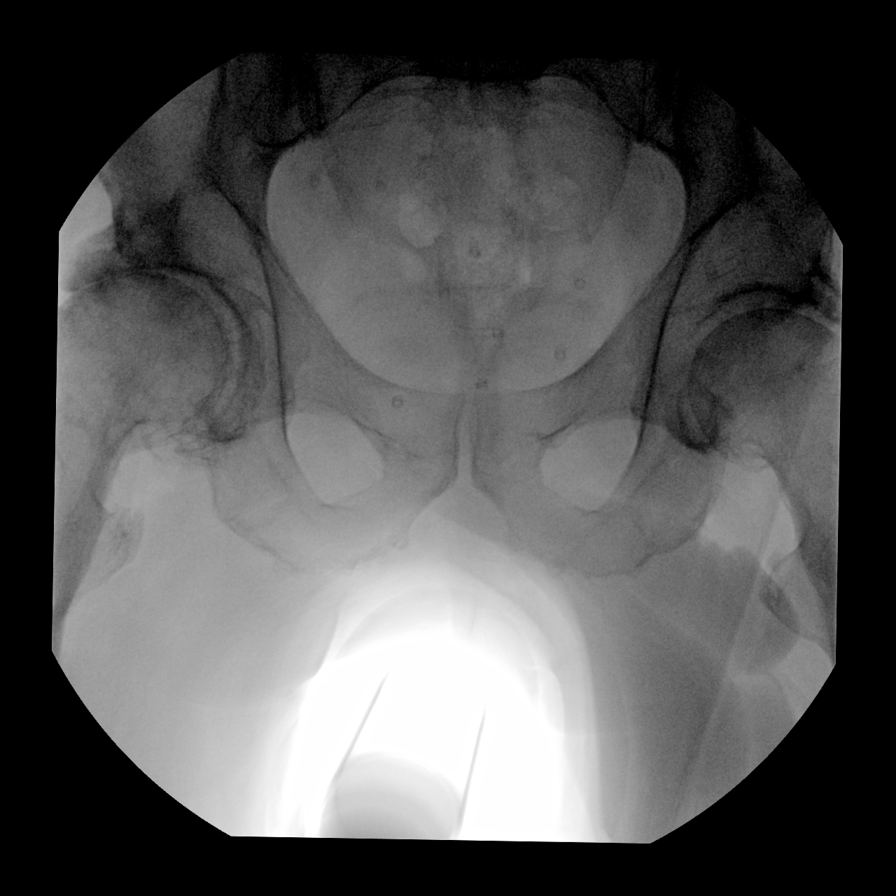
[im 3/5]
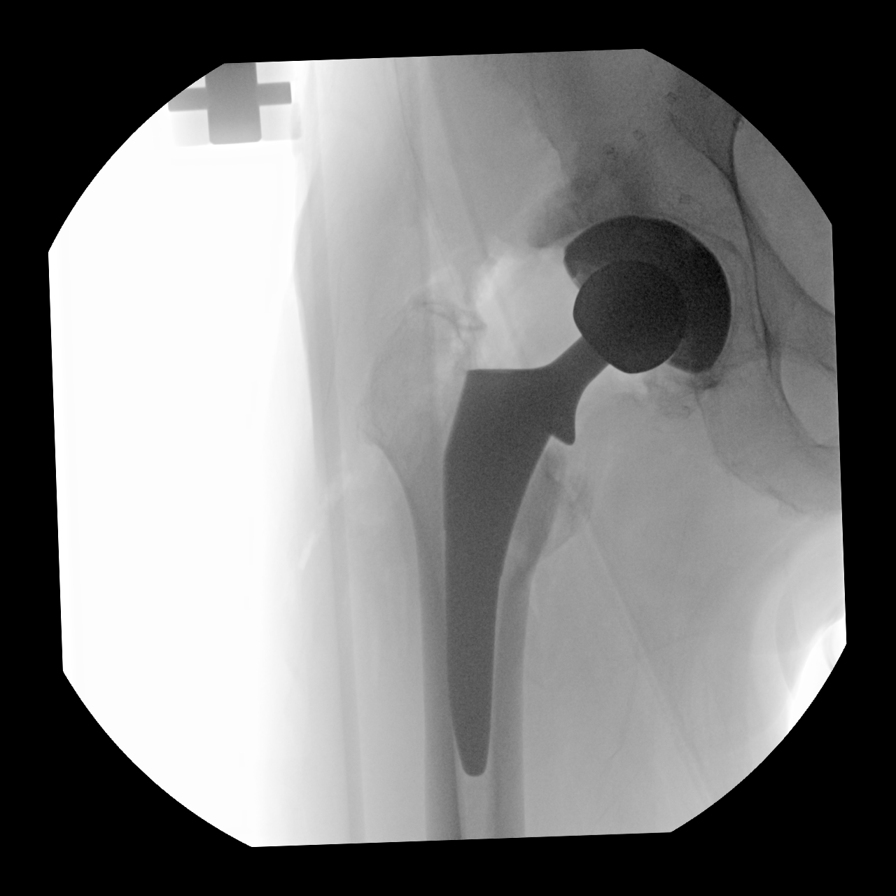
[im 4/5]
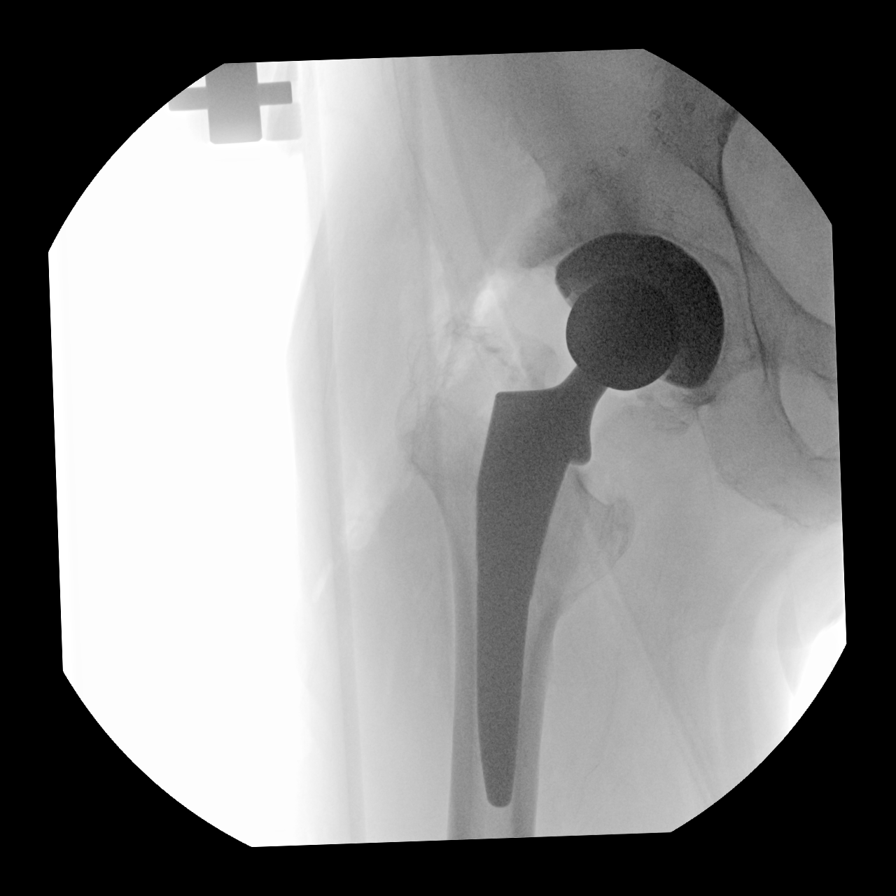
[im 5/5]
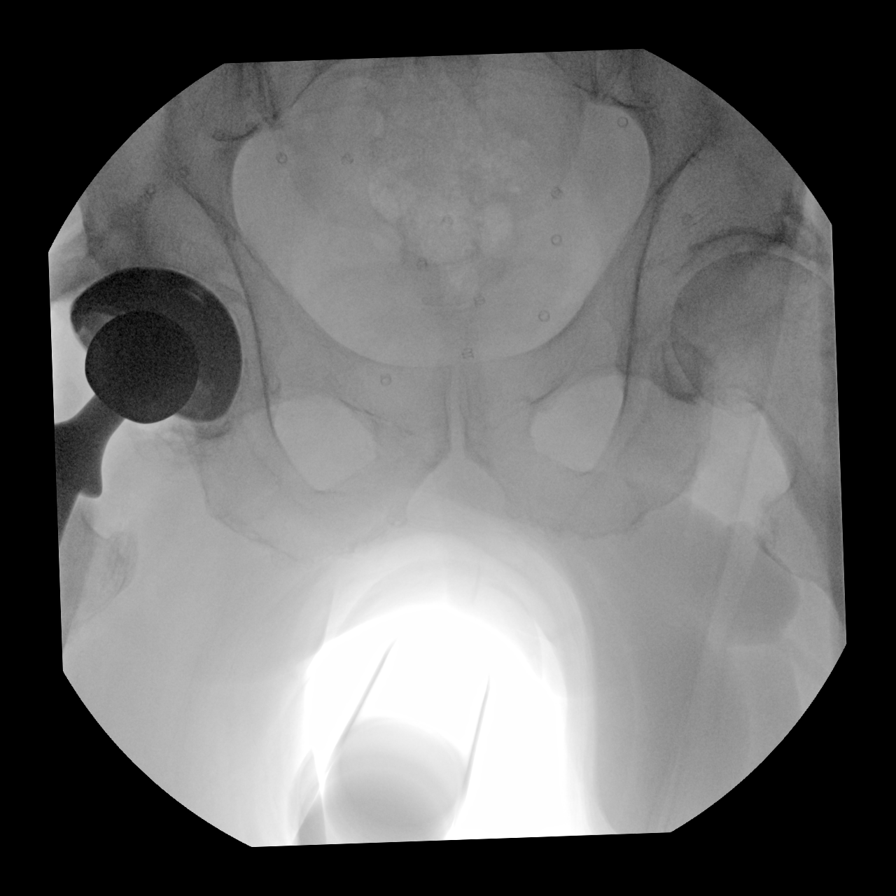

[5 of 5 positions shown; findings below may reference images not displayed]

FINDINGS: Intraoperative fluoroscopic images of the right hip are submitted
for review demonstrating total arthroplasty. No evidence of
perihardware fracture or component malpositioning.
IMPRESSION: Intraoperative fluoroscopic images of the right hip are submitted
for review demonstrating total arthroplasty. No evidence of
perihardware fracture or component malpositioning.
